# Patient Record
Sex: Male | Born: 1960 | Race: Black or African American | Hispanic: No | Marital: Married | Smoking: Former smoker
Health system: Southern US, Community
[De-identification: ages and names within clinical notes are randomized; demographics above are authoritative.]

## PROBLEM LIST (undated history)

## (undated) DIAGNOSIS — M5136 Other intervertebral disc degeneration, lumbar region: Secondary | ICD-10-CM

## (undated) DIAGNOSIS — M51369 Other intervertebral disc degeneration, lumbar region without mention of lumbar back pain or lower extremity pain: Secondary | ICD-10-CM

## (undated) DIAGNOSIS — M5430 Sciatica, unspecified side: Secondary | ICD-10-CM

## (undated) HISTORY — PX: SPINAL CORD STIMULATOR TRIAL: SHX5380

## (undated) HISTORY — PX: TOE SURGERY: SHX1073

## (undated) HISTORY — PX: APPENDECTOMY: SHX54

## (undated) HISTORY — PX: FRACTURE SURGERY: SHX138

---

## 2010-04-21 ENCOUNTER — Emergency Department: Payer: Self-pay | Admitting: Emergency Medicine

## 2013-12-03 ENCOUNTER — Emergency Department: Payer: Self-pay | Admitting: Emergency Medicine

## 2017-07-25 ENCOUNTER — Encounter: Payer: Self-pay | Admitting: Emergency Medicine

## 2017-07-25 ENCOUNTER — Emergency Department
Admission: EM | Admit: 2017-07-25 | Discharge: 2017-07-25 | Disposition: A | Discharge status: Home / Self Care | Payer: Commercial Managed Care - PPO | Attending: Emergency Medicine | Admitting: Emergency Medicine

## 2017-07-25 DIAGNOSIS — Y999 Unspecified external cause status: Secondary | ICD-10-CM | POA: Insufficient documentation

## 2017-07-25 DIAGNOSIS — S39012A Strain of muscle, fascia and tendon of lower back, initial encounter: Secondary | ICD-10-CM | POA: Insufficient documentation

## 2017-07-25 DIAGNOSIS — Y939 Activity, unspecified: Secondary | ICD-10-CM | POA: Insufficient documentation

## 2017-07-25 DIAGNOSIS — X58XXXA Exposure to other specified factors, initial encounter: Secondary | ICD-10-CM | POA: Insufficient documentation

## 2017-07-25 DIAGNOSIS — F17200 Nicotine dependence, unspecified, uncomplicated: Secondary | ICD-10-CM | POA: Diagnosis not present

## 2017-07-25 DIAGNOSIS — Y929 Unspecified place or not applicable: Secondary | ICD-10-CM | POA: Diagnosis not present

## 2017-07-25 DIAGNOSIS — S3992XA Unspecified injury of lower back, initial encounter: Secondary | ICD-10-CM | POA: Diagnosis present

## 2017-07-25 MED ORDER — NAPROXEN 500 MG PO TABS: 500.0000 mg | ORAL_TABLET | Freq: Two times a day (BID) | ORAL | 0 refills | Status: DC

## 2017-07-25 MED ORDER — KETOROLAC TROMETHAMINE 30 MG/ML IJ SOLN
30.0000 mg | Freq: Once | INTRAMUSCULAR | Status: AC
  Administered 2017-07-25: 30 mg via INTRAMUSCULAR
  Filled 2017-07-25: qty 1

## 2017-07-25 NOTE — ED Notes (Signed)
Pt discharged to home.  Discharge instructions reviewed.  Verbalized understanding.  No questions or concerns at this time.  Teach back verified.  Pt in NAD.  No items left in ED.   

## 2017-07-25 NOTE — ED Notes (Signed)
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  Pt states that he had a similar occurrence 2 months ago after painting shutters and that it went away.  Pt states this pain hasn't gone away yet.  Pt states it only hurts when he gets up and moves.  Pt states he has been taking excedrin OTC.

## 2017-07-25 NOTE — ED Triage Notes (Signed)
Lower back pain x 5 days, no injury recalled,

## 2017-07-25 NOTE — ED Provider Notes (Signed)
[facility] Emergency Department Provider Note   ____________________________________________   First MD Initiated Contact with Patient 07/25/17 1109     (approximate)  I have reviewed the triage vital signs and the nursing notes.   HISTORY  Chief Complaint Back Pain   HPI Anthony Guerrero is a 56 y.o. male is here complaining of low back pain for possibly 5 days. Patient states that he does a lot of lifting at work but denies any known injury. 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 He also lifted a grandchild yesterday with some pain. He has not taken any over-the-counter medication for his pain. He denies any paresthesias, saddle anesthesias, incontinence of bowel or bladder. He rates his pain as an 8 out of 10. Patient has continued to ambulate without assistance during this time.  History reviewed. No pertinent past medical history.  There are no active problems to display for this patient.   History reviewed. No pertinent surgical history.  Prior to Admission medications   Medication Sig Start Date End Date Taking? Authorizing Provider  naproxen (NAPROSYN) 500 MG tablet Take 1 tablet (500 mg total) by mouth 2 (two) times daily with a meal. 07/25/17   Johnn Hai, PA-C    Allergies Patient has no known allergies.  No family history on file.  Social History Social History  Substance Use Topics  . Smoking status: Current Every Day Smoker  . Smokeless tobacco: Never Used  . Alcohol use No    Review of Systems Constitutional: No fever/chills Cardiovascular: Denies chest pain. Respiratory: Denies shortness of breath. Gastrointestinal: No abdominal pain.  No nausea, no vomiting.  Genitourinary: Negative for dysuria. Musculoskeletal: Positive for low back pain. Skin: Negative for rash. Neurological: Negative for  focal weakness or numbness. ____________________________________________   PHYSICAL EXAM:  VITAL SIGNS: ED Triage  Vitals  Enc Vitals Group     BP 07/25/17 0955 131/75     Pulse Rate 07/25/17 0955 (!) 56     Resp 07/25/17 0955 18     Temp 07/25/17 0955 98.5 F (36.9 C)     Temp Source 07/25/17 0955 Oral     SpO2 07/25/17 0955 99 %     Weight 07/25/17 0949 160 lb (72.6 kg)     Height 07/25/17 0949 5\' 6"  (1.676 m)     Head Circumference --      Peak Flow --      Pain Score 07/25/17 0949 8     Pain Loc --      Pain Edu? --      Excl. in [place]? --    Constitutional: Alert and oriented. Well appearing and in no acute distress. Eyes: Conjunctivae are normal.  Head: Atraumatic. Neck: No stridor.  Nontender cervical spine to palpation posteriorly. Range of motion is without pain or restriction. Cardiovascular: Normal rate, regular rhythm. Grossly normal heart sounds.  Good peripheral circulation. Respiratory: Normal respiratory effort.  No retractions. Lungs CTAB. Gastrointestinal: Soft and nontender. No distention. No CVA tenderness. Musculoskeletal: On examination of the back there is no gross deformity. There is no tenderness on palpation of the thoracic or upper lumbar spine. There is some tenderness on palpation of the sacral area. Range of motion is slightly restricted secondary to discomfort. No active muscle spasms are seen. Patient is able to get up from sitting to standing without any assistance. Muscle strength is equal bilaterally. Straight leg raises are negative. Neurologic:  Normal speech and language. No gross focal  neurologic deficits are appreciated. No gait instability. Reflexes are 2+ bilaterally. Skin:  Skin is warm, dry and intact. No ecchymosis or abrasions are seen. Psychiatric: Mood and affect are normal. Speech and behavior are normal.  ____________________________________________   LABS (all labs ordered are listed, but only abnormal results are displayed)  Labs Reviewed - No data to display  PROCEDURES  Procedure(s) performed: None  Procedures  Critical Care performed:  No  ____________________________________________   INITIAL IMPRESSION / ASSESSMENT AND PLAN / ED COURSE Patient was given 30 mg IM in the department and was pain free prior to discharge. No imaging was done on today's visit. He was encouraged to follow up with his PCP at another clinic if any continued problems with his back.  ____________________________________________   FINAL CLINICAL IMPRESSION(S) / ED DIAGNOSES  Final diagnoses:  Strain of lumbar region, initial encounter      NEW MEDICATIONS STARTED DURING THIS VISIT:  New Prescriptions   NAPROXEN (NAPROSYN) 500 MG TABLET    Take 1 tablet (500 mg total) by mouth 2 (two) times daily with a meal.     Note:  This document was prepared using Dragon voice recognition software and may include unintentional dictation errors.    Johnn Hai, PA-C 07/25/17 1248    Earleen Newport, MD 07/25/17 [phone]

## 2017-07-25 NOTE — Discharge Instructions (Signed)
Follow-up with your primary care doctor or [facility] if any continued problems. Ice or heat to her back as needed for comfort. Begin taking naproxen 500 mg twice a day with food.

## 2017-10-09 ENCOUNTER — Other Ambulatory Visit: Payer: Self-pay | Admitting: Emergency Medicine

## 2018-04-24 DIAGNOSIS — H40023 Open angle with borderline findings, high risk, bilateral: Secondary | ICD-10-CM | POA: Diagnosis not present

## 2018-07-31 DIAGNOSIS — Z1211 Encounter for screening for malignant neoplasm of colon: Secondary | ICD-10-CM | POA: Diagnosis not present

## 2018-08-29 DIAGNOSIS — D128 Benign neoplasm of rectum: Secondary | ICD-10-CM | POA: Diagnosis not present

## 2018-08-29 DIAGNOSIS — K621 Rectal polyp: Secondary | ICD-10-CM | POA: Diagnosis not present

## 2018-08-29 DIAGNOSIS — Z1211 Encounter for screening for malignant neoplasm of colon: Secondary | ICD-10-CM | POA: Diagnosis not present

## 2018-08-29 DIAGNOSIS — K635 Polyp of colon: Secondary | ICD-10-CM | POA: Diagnosis not present

## 2018-08-29 DIAGNOSIS — K640 First degree hemorrhoids: Secondary | ICD-10-CM | POA: Diagnosis not present

## 2018-08-29 DIAGNOSIS — D127 Benign neoplasm of rectosigmoid junction: Secondary | ICD-10-CM | POA: Diagnosis not present

## 2018-08-30 DIAGNOSIS — Z0000 Encounter for general adult medical examination without abnormal findings: Secondary | ICD-10-CM | POA: Diagnosis not present

## 2018-08-30 DIAGNOSIS — R21 Rash and other nonspecific skin eruption: Secondary | ICD-10-CM | POA: Diagnosis not present

## 2018-08-30 DIAGNOSIS — M79675 Pain in left toe(s): Secondary | ICD-10-CM | POA: Diagnosis not present

## 2018-09-04 DIAGNOSIS — Z125 Encounter for screening for malignant neoplasm of prostate: Secondary | ICD-10-CM | POA: Diagnosis not present

## 2018-09-04 DIAGNOSIS — Z0000 Encounter for general adult medical examination without abnormal findings: Secondary | ICD-10-CM | POA: Diagnosis not present

## 2018-09-06 DIAGNOSIS — R7301 Impaired fasting glucose: Secondary | ICD-10-CM | POA: Diagnosis not present

## 2018-09-30 ENCOUNTER — Other Ambulatory Visit: Payer: Self-pay

## 2018-09-30 ENCOUNTER — Encounter: Payer: Self-pay | Admitting: Emergency Medicine

## 2018-09-30 ENCOUNTER — Emergency Department
Admission: EM | Admit: 2018-09-30 | Discharge: 2018-09-30 | Disposition: A | Discharge status: Home / Self Care | Payer: Commercial Managed Care - PPO | Attending: Emergency Medicine | Admitting: Emergency Medicine

## 2018-09-30 ENCOUNTER — Emergency Department: Payer: Commercial Managed Care - PPO

## 2018-09-30 DIAGNOSIS — Y998 Other external cause status: Secondary | ICD-10-CM | POA: Diagnosis not present

## 2018-09-30 DIAGNOSIS — W228XXA Striking against or struck by other objects, initial encounter: Secondary | ICD-10-CM | POA: Diagnosis not present

## 2018-09-30 DIAGNOSIS — Y9389 Activity, other specified: Secondary | ICD-10-CM | POA: Diagnosis not present

## 2018-09-30 DIAGNOSIS — S92514A Nondisplaced fracture of proximal phalanx of right lesser toe(s), initial encounter for closed fracture: Secondary | ICD-10-CM | POA: Diagnosis not present

## 2018-09-30 DIAGNOSIS — Y92019 Unspecified place in single-family (private) house as the place of occurrence of the external cause: Secondary | ICD-10-CM | POA: Diagnosis not present

## 2018-09-30 DIAGNOSIS — Z79899 Other long term (current) drug therapy: Secondary | ICD-10-CM | POA: Diagnosis not present

## 2018-09-30 DIAGNOSIS — S99921A Unspecified injury of right foot, initial encounter: Secondary | ICD-10-CM | POA: Diagnosis present

## 2018-09-30 DIAGNOSIS — F17200 Nicotine dependence, unspecified, uncomplicated: Secondary | ICD-10-CM | POA: Diagnosis not present

## 2018-09-30 MED ORDER — MELOXICAM 15 MG PO TABS: 15.0000 mg | ORAL_TABLET | Freq: Every day | ORAL | 0 refills | Status: DC

## 2018-09-30 NOTE — ED Triage Notes (Signed)
R pinky toe injury 4 days ago, still painful.

## 2018-09-30 NOTE — ED Provider Notes (Signed)
[facility] Emergency Department Provider Note  ____________________________________________  Time seen: Approximately 4:53 PM  I have reviewed the triage vital signs and the nursing notes.   HISTORY  Chief Complaint Toe Pain    HPI Anthony Guerrero is a 57 y.o. male who presents emergency department complaining of fifth digit right foot pain.  Patient reports that 4 days ago, he was walking through the house, accidentally struck his foot on a piece of furniture.  Patient reports that his toe went "sideways" and he felt a pop.  Patient reports that he has had ongoing pain, while this is improving, the pain is still constant.  Much worse with weightbearing or movement.  Patient was concerned that he may have broke his foot or his toe.  No medications for his complaint prior to arrival.  No other injury or complaint at this time.  Patient is ambulatory at this time with a slight limp.    History reviewed. No pertinent past medical history.  There are no active problems to display for this patient.   History reviewed. No pertinent surgical history.  Prior to Admission medications   Medication Sig Start Date End Date Taking? Authorizing Provider  meloxicam (MOBIC) 15 MG tablet Take 1 tablet (15 mg total) by mouth daily. 09/30/18   Babs Dabbs, Charline Bills, PA-C  naproxen (NAPROSYN) 500 MG tablet Take 1 tablet (500 mg total) by mouth 2 (two) times daily with a meal. 07/25/17   Johnn Hai, PA-C    Allergies Patient has no known allergies.  No family history on file.  Social History Social History   Tobacco Use  . Smoking status: Current Every Day Smoker  . Smokeless tobacco: Never Used  Substance Use Topics  . Alcohol use: No  . Drug use: No     Review of Systems  Constitutional: No fever/chills Eyes: No visual changes. No discharge ENT: No upper respiratory complaints. Cardiovascular: no chest pain. Respiratory: no cough. No  SOB. Gastrointestinal: No abdominal pain.  No nausea, no vomiting.   Musculoskeletal: Positive for fifth toe pain Skin: Negative for rash, abrasions, lacerations, ecchymosis. Neurological: Negative for headaches, focal weakness or numbness. 10-point ROS otherwise negative.  ____________________________________________   PHYSICAL EXAM:  VITAL SIGNS: ED Triage Vitals  Enc Vitals Group     BP 09/30/18 1501 (!) 112/48     Pulse Rate 09/30/18 1501 60     Resp 09/30/18 1501 20     Temp 09/30/18 1501 98.2 F (36.8 C)     Temp Source 09/30/18 1501 Oral     SpO2 09/30/18 1501 100 %     Weight 09/30/18 1502 167 lb (75.8 kg)     Height 09/30/18 1502 5\' 6"  (1.676 m)     Head Circumference --      Peak Flow --      Pain Score 09/30/18 1502 3     Pain Loc --      Pain Edu? --      Excl. in [place]? --      Constitutional: Alert and oriented. Well appearing and in no acute distress. Eyes: Conjunctivae are normal. PERRL. EOMI. Head: Atraumatic. Neck: No stridor.    Cardiovascular: Normal rate, regular rhythm. Normal S1 and S2.  Good peripheral circulation. Respiratory: Normal respiratory effort without tachypnea or retractions. Lungs CTAB. Good air entry to the bases with no decreased or absent breath sounds. Musculoskeletal: Full range of motion to all extremities. No gross deformities appreciated.  Visualization of  the right foot reveals no gross abnormality.  Patient does have minimal edema to the proximal phalanx of the fifth digit.  No ecchymosis, lacerations or abrasions.  Patient is very tender to palpation over the proximal phalanx of the fifth digit.  No tenderness to palpation of the osseous structures of the foot.  Sensation and capillary refill intact to the digit. Neurologic:  Normal speech and language. No gross focal neurologic deficits are appreciated.  Skin:  Skin is warm, dry and intact. No rash noted. Psychiatric: Mood and affect are normal. Speech and behavior are normal.  Patient exhibits appropriate insight and judgement.   ____________________________________________   LABS (all labs ordered are listed, but only abnormal results are displayed)  Labs Reviewed - No data to display ____________________________________________  EKG   ____________________________________________  RADIOLOGY I personally viewed and evaluated these images as part of my medical decision making, as well as reviewing the written report by the radiologist.  Dg Foot Complete Right  Result Date: 09/30/2018 CLINICAL DATA:  Fifth toe injury 4 days ago.  Pain. EXAM: RIGHT FOOT COMPLETE - 3+ VIEW COMPARISON:  None. FINDINGS: There is a fracture through the proximal fifth phalanx without significant displacement. IMPRESSION: There is a nondisplaced fracture through the proximal fifth phalanx. Electronically Signed   By: Dorise Bullion III M.D   On: 09/30/2018 16:34    ____________________________________________    PROCEDURES  Procedure(s) performed:    .Ortho Injury Treatment Date/Time: 09/30/2018 5:09 PM Performed by: Darletta Moll, PA-C Authorized by: Darletta Moll, PA-C   Consent:    Consent obtained:  Verbal   Consent given by:  Patient   Risks discussed:  FractureInjury location: toe Location details: right fifth toe Injury type: fracture Fracture type: proximal phalanx Pre-procedure neurovascular assessment: neurovascularly intact Pre-procedure distal perfusion: normal Pre-procedure neurological function: normal Pre-procedure range of motion: reduced Manipulation performed: no Immobilization: tape (Post-op shoe) Post-procedure neurovascular assessment: post-procedure neurovascularly intact Post-procedure distal perfusion: normal Post-procedure neurological function: normal Post-procedure range of motion: unchanged Patient tolerance: Patient tolerated the procedure well with no immediate complications       Medications - No data to  display   ____________________________________________   INITIAL IMPRESSION / ASSESSMENT AND PLAN / ED COURSE  Pertinent labs & imaging results that were available during my care of the patient were reviewed by me and considered in my medical decision making (see chart for details).  Review of the [place] CSRS was performed in accordance of the [place] prior to dispensing any controlled drugs.      Patient's diagnosis is consistent with toe fracture.  Patient presents emergency department with ongoing pain to the fifth digit of the right foot.  On exam, minimal edema over the proximal phalanx.  Pain is localized to this area.  Imaging reveals nondisplaced fracture of the proximal phalanx of the fifth digit.  Patient's toe was buddy taped, postop shoe is given.  Patient to be prescribed meloxicam for additional symptom relief.  Follow-up with primary care or podiatry as needed. Patient is given ED precautions to return to the ED for any worsening or new symptoms.     ____________________________________________  FINAL CLINICAL IMPRESSION(S) / ED DIAGNOSES  Final diagnoses:  Closed nondisplaced fracture of proximal phalanx of lesser toe of right foot, initial encounter      NEW MEDICATIONS STARTED DURING THIS VISIT:  ED Discharge Orders         Ordered    meloxicam (MOBIC) 15 MG tablet  Daily  09/30/18 1711              This chart was dictated using voice recognition software/Dragon. Despite best efforts to proofread, errors can occur which can change the meaning. Any change was purely unintentional.    Darletta Moll, PA-C 09/30/18 1711    Lavonia Drafts, MD 09/30/18 1818

## 2018-12-23 ENCOUNTER — Emergency Department
Admission: EM | Admit: 2018-12-23 | Discharge: 2018-12-23 | Disposition: A | Discharge status: Home / Self Care | Payer: Commercial Managed Care - PPO | Attending: Emergency Medicine | Admitting: Emergency Medicine

## 2018-12-23 ENCOUNTER — Other Ambulatory Visit: Payer: Self-pay

## 2018-12-23 ENCOUNTER — Encounter: Payer: Self-pay | Admitting: Emergency Medicine

## 2018-12-23 DIAGNOSIS — R509 Fever, unspecified: Secondary | ICD-10-CM | POA: Diagnosis present

## 2018-12-23 DIAGNOSIS — Z87891 Personal history of nicotine dependence: Secondary | ICD-10-CM | POA: Insufficient documentation

## 2018-12-23 DIAGNOSIS — J101 Influenza due to other identified influenza virus with other respiratory manifestations: Secondary | ICD-10-CM

## 2018-12-23 DIAGNOSIS — Z79899 Other long term (current) drug therapy: Secondary | ICD-10-CM | POA: Diagnosis not present

## 2018-12-23 DIAGNOSIS — R05 Cough: Secondary | ICD-10-CM | POA: Diagnosis not present

## 2018-12-23 LAB — INFLUENZA PANEL BY PCR (TYPE A & B)
Influenza A By PCR: POSITIVE — AB
Influenza B By PCR: NEGATIVE

## 2018-12-23 MED ORDER — IBUPROFEN 800 MG PO TABS
800.0000 mg | ORAL_TABLET | Freq: Once | ORAL | Status: AC
  Administered 2018-12-23: 800 mg via ORAL
  Filled 2018-12-23: qty 1

## 2018-12-23 MED ORDER — FLUTICASONE PROPIONATE 50 MCG/ACT NA SUSP: 1.0000 | Freq: Two times a day (BID) | NASAL | 0 refills | Status: DC

## 2018-12-23 MED ORDER — BENZONATATE 100 MG PO CAPS: 100.0000 mg | ORAL_CAPSULE | Freq: Four times a day (QID) | ORAL | 0 refills | Status: AC | PRN

## 2018-12-23 NOTE — ED Provider Notes (Signed)
[facility] Emergency Department Provider Note  ____________________________________________  Time seen: Approximately 9:20 PM  I have reviewed the triage vital signs and the nursing notes.   HISTORY  Chief Complaint Sore Throat; Fever; Cough; and Generalized Body Aches    HPI Anthony Guerrero is a 58 y.o. male who presents the emergency department with 2-day history of fever, cough, body aches.  Patient reports that symptoms hit rather suddenly.  Patient has been using DayQuil and NyQuil without significant relief.  Patient denies any visual changes, neck pain or stiffness, chest pain, shortness of breath, abdominal pain, nausea or vomiting.         History reviewed. No pertinent past medical history.  There are no active problems to display for this patient.   History reviewed. No pertinent surgical history.  Prior to Admission medications   Medication Sig Start Date End Date Taking? Authorizing Provider  augmented betamethasone dipropionate (DIPROLENE-AF) 0.05 % cream Apply topically 2 (two) times daily.   Yes [provider]  latanoprost (XALATAN) 0.005 % ophthalmic solution Place 1 drop into both eyes at bedtime.   Yes [provider]  benzonatate (TESSALON PERLES) 100 MG capsule Take 1 capsule (100 mg total) by mouth every 6 (six) hours as needed for cough. 12/23/18 12/23/19  Tayanna Talford, Charline Bills, PA-C  fluticasone (FLONASE) 50 MCG/ACT nasal spray Place 1 spray into both nostrils 2 (two) times daily. 12/23/18   Deundra Furber, Charline Bills, PA-C    Allergies Patient has no known allergies.  History reviewed. No pertinent family history.  Social History Social History   Tobacco Use  . Smoking status: Former Research scientist (life sciences)  . Smokeless tobacco: Never Used  . Tobacco comment: quit 26 years ago  Substance Use Topics  . Alcohol use: No  . Drug use: No     Review of Systems  Constitutional: Positive fever/chills Eyes: No visual changes.  No discharge ENT: Positive for nasal congestion Cardiovascular: no chest pain. Respiratory: Positive cough. No SOB. Gastrointestinal: No abdominal pain.  No nausea, no vomiting.  No diarrhea.  No constipation. Musculoskeletal: Negative for musculoskeletal pain. Skin: Negative for rash, abrasions, lacerations, ecchymosis. Neurological: Negative for headaches, focal weakness or numbness. 10-point ROS otherwise negative.  ____________________________________________   PHYSICAL EXAM:  VITAL SIGNS: ED Triage Vitals [12/23/18 1929]  Enc Vitals Group     BP 133/79     Pulse Rate 86     Resp 18     Temp (!) 100.6 F (38.1 C)     Temp Source Oral     SpO2 98 %     Weight 168 lb (76.2 kg)     Height 5\' 6"  (1.676 m)     Head Circumference      Peak Flow      Pain Score 3     Pain Loc      Pain Edu?      Excl. in [place]?      Constitutional: Alert and oriented. Well appearing and in no acute distress. Eyes: Conjunctivae are normal. PERRL. EOMI. Head: Atraumatic. ENT:      Ears: EACs and TMs unremarkable bilaterally.      Nose: No congestion/rhinnorhea.      Mouth/Throat: Mucous membranes are moist.  Neck: No stridor.  Neck is supple full range of motion Hematological/Lymphatic/Immunilogical: Scattered, mobile, nontender anterior cervical lymphadenopathy. Cardiovascular: Normal rate, regular rhythm. Normal S1 and S2.  Good peripheral circulation. Respiratory: Normal respiratory effort without tachypnea or retractions. Lungs CTAB. Good air  entry to the bases with no decreased or absent breath sounds. Musculoskeletal: Full range of motion to all extremities. No gross deformities appreciated. Neurologic:  Normal speech and language. No gross focal neurologic deficits are appreciated.  Skin:  Skin is warm, dry and intact. No rash noted. Psychiatric: Mood and affect are normal. Speech and behavior are normal. Patient exhibits appropriate insight and  judgement.   ____________________________________________   LABS (all labs ordered are listed, but only abnormal results are displayed)  Labs Reviewed  INFLUENZA PANEL BY PCR (TYPE A & B) - Abnormal; Notable for the following components:      Result Value   Influenza A By PCR POSITIVE (*)    All other components within normal limits   ____________________________________________  EKG   ____________________________________________  RADIOLOGY   No results found.  ____________________________________________    PROCEDURES  Procedure(s) performed:    Procedures    Medications  ibuprofen (ADVIL,MOTRIN) tablet 800 mg (800 mg Oral Given 12/23/18 1939)     ____________________________________________   INITIAL IMPRESSION / ASSESSMENT AND PLAN / ED COURSE  Pertinent labs & imaging results that were available during my care of the patient were reviewed by me and considered in my medical decision making (see chart for details).  Review of the [place] CSRS was performed in accordance of the [place] prior to dispensing any controlled drugs.           Patient's diagnosis is consistent with influenza A.  Patient presents the emergency department with flulike symptoms.  Patient is positive for influenza A on testing.  After discussion about Tamiflu, patient declines Tamiflu.  Tylenol Motrin at home.  Patient will have Flonase, Tessalon Perles for additional symptom control.  Follow-up with primary care as needed..Patient is given ED precautions to return to the ED for any worsening or new symptoms.     ____________________________________________  FINAL CLINICAL IMPRESSION(S) / ED DIAGNOSES  Final diagnoses:  Influenza A      NEW MEDICATIONS STARTED DURING THIS VISIT:  ED Discharge Orders         Ordered    fluticasone (FLONASE) 50 MCG/ACT nasal spray  2 times daily     12/23/18 2122    benzonatate (TESSALON PERLES) 100 MG capsule  Every 6 hours PRN     12/23/18  2122              This chart was dictated using voice recognition software/Dragon. Despite best efforts to proofread, errors can occur which can change the meaning. Any change was purely unintentional.    Darletta Moll, PA-C 12/23/18 2123    Nance Pear, MD 12/23/18 [phone]

## 2018-12-23 NOTE — ED Triage Notes (Signed)
Pt says yesterday he started with a scratchy throat; now with headache, body aches, non productive cough and fever noted today in triage; pt says taking daytime/night time nyquil with no relief

## 2018-12-23 NOTE — ED Notes (Signed)
See triage note. Pt with fever, cough, generalized body aches and malaise x2 days. Pt self medicating with OTC nyquil/dayquil. Last dose at 1400 today. Pt [id number].

## 2018-12-23 NOTE — ED Triage Notes (Signed)
FIRST NURSE NOTE-flu like symptoms. NAD. Mask applied.

## 2018-12-25 DIAGNOSIS — J101 Influenza due to other identified influenza virus with other respiratory manifestations: Secondary | ICD-10-CM | POA: Diagnosis not present

## 2018-12-25 DIAGNOSIS — R52 Pain, unspecified: Secondary | ICD-10-CM | POA: Diagnosis not present

## 2020-01-14 ENCOUNTER — Ambulatory Visit: Payer: Commercial Managed Care - PPO | Attending: Internal Medicine

## 2020-01-14 DIAGNOSIS — Z23 Encounter for immunization: Secondary | ICD-10-CM

## 2020-01-14 NOTE — Progress Notes (Signed)
   Covid-19 Vaccination Clinic  Name:  Anthony Guerrero    MRN: [id number] DOB: 1961/03/01  01/14/2020  Mr. Purk was observed post Covid-19 immunization for 15 minutes without incident. He was provided with Vaccine Information Sheet and instruction to access the V-Safe system.   Mr. Brisky was instructed to call 911 with any severe reactions post vaccine: Marland Kitchen Difficulty breathing  . Swelling of face and throat  . A fast heartbeat  . A bad rash all over body  . Dizziness and weakness   Immunizations Administered    Name Date Dose VIS Date Route   Pfizer COVID-19 Vaccine 01/14/2020 10:01 AM 0.3 mL 09/28/2019 Intramuscular   Manufacturer: [employer]   Lot: [id number]   [place]: [id number]

## 2020-02-04 ENCOUNTER — Ambulatory Visit: Payer: Commercial Managed Care - PPO | Attending: Internal Medicine

## 2020-02-04 DIAGNOSIS — Z23 Encounter for immunization: Secondary | ICD-10-CM

## 2020-02-04 NOTE — Progress Notes (Signed)
   Covid-19 Vaccination Clinic  Name:  LERAY GRUMBINE    MRN: [id number] DOB: [birth year]  02/04/2020  Mr. Manspeaker was observed post Covid-19 immunization for 15 minutes without incident. He was provided with Vaccine Information Sheet and instruction to access the V-Safe system.   Mr. Messenger was instructed to call 911 with any severe reactions post vaccine: Marland Kitchen Difficulty breathing  . Swelling of face and throat  . A fast heartbeat  . A bad rash all over body  . Dizziness and weakness   Immunizations Administered    Name Date Dose VIS Date Route   Pfizer COVID-19 Vaccine 02/04/2020 10:11 AM 0.3 mL 12/12/2018 Intramuscular   Manufacturer: [employer]   Lot: [id number]   [place]: [id number]

## 2020-04-28 ENCOUNTER — Other Ambulatory Visit: Payer: Self-pay

## 2020-04-28 ENCOUNTER — Emergency Department
Admission: EM | Admit: 2020-04-28 | Discharge: 2020-04-28 | Disposition: A | Discharge status: Home / Self Care | Payer: Worker's Compensation | Attending: Emergency Medicine | Admitting: Emergency Medicine

## 2020-04-28 ENCOUNTER — Encounter: Payer: Self-pay | Admitting: Emergency Medicine

## 2020-04-28 ENCOUNTER — Emergency Department: Payer: Worker's Compensation

## 2020-04-28 DIAGNOSIS — S9031XA Contusion of right foot, initial encounter: Secondary | ICD-10-CM | POA: Insufficient documentation

## 2020-04-28 DIAGNOSIS — Y939 Activity, unspecified: Secondary | ICD-10-CM | POA: Insufficient documentation

## 2020-04-28 DIAGNOSIS — Y9289 Other specified places as the place of occurrence of the external cause: Secondary | ICD-10-CM | POA: Insufficient documentation

## 2020-04-28 DIAGNOSIS — W19XXXA Unspecified fall, initial encounter: Secondary | ICD-10-CM | POA: Diagnosis not present

## 2020-04-28 DIAGNOSIS — Z87891 Personal history of nicotine dependence: Secondary | ICD-10-CM | POA: Diagnosis not present

## 2020-04-28 DIAGNOSIS — S99921A Unspecified injury of right foot, initial encounter: Secondary | ICD-10-CM | POA: Diagnosis present

## 2020-04-28 DIAGNOSIS — Y999 Unspecified external cause status: Secondary | ICD-10-CM | POA: Insufficient documentation

## 2020-04-28 DIAGNOSIS — T1490XA Injury, unspecified, initial encounter: Secondary | ICD-10-CM

## 2020-04-28 MED ORDER — TRAMADOL HCL 50 MG PO TABS: 50.0000 mg | ORAL_TABLET | Freq: Four times a day (QID) | ORAL | 0 refills | Status: DC | PRN

## 2020-04-28 MED ORDER — LIDOCAINE 5 % EX PTCH
1.0000 | MEDICATED_PATCH | CUTANEOUS | Status: DC
  Administered 2020-04-28: 14:00:00 1 via TRANSDERMAL
  Filled 2020-04-28: qty 1

## 2020-04-28 MED ORDER — IBUPROFEN 600 MG PO TABS: 600.0000 mg | ORAL_TABLET | Freq: Three times a day (TID) | ORAL | 0 refills | Status: DC | PRN

## 2020-04-28 NOTE — ED Triage Notes (Signed)
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

## 2020-04-28 NOTE — ED Notes (Addendum)
Pt states pain to right foot after incident at work. 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

## 2020-04-28 NOTE — ED Provider Notes (Signed)
[facility] Emergency Department Provider Note   ____________________________________________   First MD Initiated Contact with Patient 04/28/20 1310     (approximate)  I have reviewed the triage vital signs and the nursing notes.   HISTORY  Chief Complaint Foot Injury    HPI Anthony Guerrero is a 59 y.o. male patient presents with right foot pain secondary to contusion. Patient state he was moving a table at work when he fell on his right foot injuring the first and second toe. Patient does not want to follow-up Worker's Compensation. Rates pain as 8/10. Describes pain as "achy". No palliative measures prior to arrival.         History reviewed. No pertinent past medical history.  There are no problems to display for this patient.   History reviewed. No pertinent surgical history.  Prior to Admission medications   Medication Sig Start Date End Date Taking? Authorizing Provider  augmented betamethasone dipropionate (DIPROLENE-AF) 0.05 % cream Apply topically 2 (two) times daily.    [provider]  fluticasone (FLONASE) 50 MCG/ACT nasal spray Place 1 spray into both nostrils 2 (two) times daily. 12/23/18   Cuthriell, Charline Bills, PA-C  ibuprofen (ADVIL) 600 MG tablet Take 1 tablet (600 mg total) by mouth every 8 (eight) hours as needed. 04/28/20   Sable Feil, PA-C  latanoprost (XALATAN) 0.005 % ophthalmic solution Place 1 drop into both eyes at bedtime.    [provider]  traMADol (ULTRAM) 50 MG tablet Take 1 tablet (50 mg total) by mouth every 6 (six) hours as needed for moderate pain. 04/28/20   Sable Feil, PA-C    Allergies Patient has no known allergies.  No family history on file.  Social History Social History   Tobacco Use  . Smoking status: Former Research scientist (life sciences)  . Smokeless tobacco: Never Used  . Tobacco comment: quit 26 years ago  Substance Use Topics  . Alcohol use: No  . Drug use: No    Review of  Systems Constitutional: No fever/chills Eyes: No visual changes. ENT: No sore throat. Cardiovascular: Denies chest pain. Respiratory: Denies shortness of breath. Gastrointestinal: No abdominal pain.  No nausea, no vomiting.  No diarrhea.  No constipation. Genitourinary: Negative for dysuria. Musculoskeletal: Right foot pain involving the first and second digit.  Skin: Negative for rash. Neurological: Negative for headaches, focal weakness or numbness.   ____________________________________________   PHYSICAL EXAM:  VITAL SIGNS: ED Triage Vitals  Enc Vitals Group     BP 04/28/20 1308 113/89     Pulse Rate 04/28/20 1308 62     Resp 04/28/20 1308 16     Temp 04/28/20 1308 98.3 F (36.8 C)     Temp Source 04/28/20 1308 Oral     SpO2 04/28/20 1308 98 %     Weight 04/28/20 1305 167 lb 15.9 oz (76.2 kg)     Height 04/28/20 1305 5\' 6"  (1.676 m)     Head Circumference --      Peak Flow --      Pain Score 04/28/20 1304 8     Pain Loc --      Pain Edu? --      Excl. in [place]? --    Constitutional: Alert and oriented. Well appearing and in no acute distress. Cardiovascular: Normal rate, regular rhythm. Grossly normal heart sounds.  Good peripheral circulation. Respiratory: Normal respiratory effort.  No retractions. Lungs CTAB. Musculoskeletal: Edema first and second digit right foot. Neurologic:  Normal speech and language. No gross focal neurologic deficits are appreciated. No gait instability. Skin:  Skin is warm, dry and intact. No rash noted. Ecchymosis first digit right foot.   ____________________________________________   LABS (all labs ordered are listed, but only abnormal results are displayed)  Labs Reviewed - No data to display ____________________________________________  EKG   ____________________________________________  RADIOLOGY  ED MD interpretation:    Official radiology report(s): DG Foot Complete Right  Result Date: 04/28/2020 CLINICAL DATA:   Heavy object fell on foot EXAM: RIGHT FOOT COMPLETE - 3+ VIEW COMPARISON:  None. FINDINGS: Frontal, oblique, and lateral views were obtained. There is no fracture or dislocation. The joint spaces appear normal. There is a small inferior calcaneal spur. IMPRESSION: No fracture or dislocation. No appreciable arthropathy. Small inferior calcaneal spur present. Electronically Signed   By: Lowella Grip III M.D.   On: 04/28/2020 13:32    ____________________________________________   PROCEDURES  Procedure(s) performed (including Critical Care):  Procedures   ____________________________________________   INITIAL IMPRESSION / ASSESSMENT AND PLAN / ED COURSE  As part of my medical decision making, I reviewed the following data within the [place]     Patient presents with right foot pain secondary to contusion by a table that fell during a lifting incident.  Discussed no acute findings on x-ray of the right foot.  Patient complaint physical exam consistent with right foot contusion.  Patient given discharge care instructions advised take medication as needed.  Patient advised to follow-up with PCP.  Patient did not want to report this is a work-related injury.    Anthony Guerrero was evaluated in Emergency Department on 04/28/2020 for the symptoms described in the history of present illness. He was evaluated in the context of the global COVID-19 pandemic, which necessitated consideration that the patient might be at risk for infection with the SARS-CoV-2 virus that causes COVID-19. Institutional protocols and algorithms that pertain to the evaluation of patients at risk for COVID-19 are in a state of rapid change based on information released by regulatory bodies including the CDC and federal and state organizations. These policies and algorithms were followed during the patient's care in the ED.       ____________________________________________   FINAL CLINICAL  IMPRESSION(S) / ED DIAGNOSES  Final diagnoses:  Trauma  Contusion of right foot, initial encounter     ED Discharge Orders         Ordered    traMADol (ULTRAM) 50 MG tablet  Every 6 hours PRN     Discontinue  Reprint     04/28/20 1402    ibuprofen (ADVIL) 600 MG tablet  Every 8 hours PRN     Discontinue  Reprint     04/28/20 1402           Note:  This document was prepared using Dragon voice recognition software and may include unintentional dictation errors.    Sable Feil, PA-C 04/28/20 1416    Duffy Bruce, MD 04/29/20 [phone]

## 2020-04-29 ENCOUNTER — Other Ambulatory Visit: Payer: Self-pay

## 2020-04-29 ENCOUNTER — Encounter: Payer: Self-pay | Admitting: Emergency Medicine

## 2020-04-29 DIAGNOSIS — Y929 Unspecified place or not applicable: Secondary | ICD-10-CM | POA: Insufficient documentation

## 2020-04-29 DIAGNOSIS — S90111A Contusion of right great toe without damage to nail, initial encounter: Secondary | ICD-10-CM | POA: Diagnosis not present

## 2020-04-29 DIAGNOSIS — S90931A Unspecified superficial injury of right great toe, initial encounter: Secondary | ICD-10-CM | POA: Diagnosis present

## 2020-04-29 DIAGNOSIS — Y999 Unspecified external cause status: Secondary | ICD-10-CM | POA: Insufficient documentation

## 2020-04-29 DIAGNOSIS — W208XXA Other cause of strike by thrown, projected or falling object, initial encounter: Secondary | ICD-10-CM | POA: Insufficient documentation

## 2020-04-29 DIAGNOSIS — Z87891 Personal history of nicotine dependence: Secondary | ICD-10-CM | POA: Diagnosis not present

## 2020-04-29 DIAGNOSIS — Y939 Activity, unspecified: Secondary | ICD-10-CM | POA: Diagnosis not present

## 2020-04-29 NOTE — ED Triage Notes (Signed)
Patient ambulatory to triage with steady gait, without difficulty or distress noted; pt seen yesterday for rt foot injury; returns today for concern over large blister to great toe

## 2020-04-30 ENCOUNTER — Emergency Department
Admission: EM | Admit: 2020-04-30 | Discharge: 2020-04-30 | Disposition: A | Discharge status: Home / Self Care | Payer: Worker's Compensation | Attending: Emergency Medicine | Admitting: Emergency Medicine

## 2020-04-30 DIAGNOSIS — S90121A Contusion of right lesser toe(s) without damage to nail, initial encounter: Secondary | ICD-10-CM

## 2020-04-30 NOTE — ED Notes (Signed)
Boot applied with abd pad placed over foot for added protection. Crutches provided and explained by this RN. Pt denies further questions at this time and was able to ambulate to care after RN wheeled him to parking lot in wheelchair.

## 2020-04-30 NOTE — ED Provider Notes (Signed)
[facility] Emergency Department Provider Note   ____________________________________________   First MD Initiated Contact with Patient 04/30/20 [phone]     (approximate)  I have reviewed the triage vital signs and the nursing notes.   HISTORY  Chief Complaint Foot Injury    HPI Anthony Guerrero is a 59 y.o. male who returns to the ED for evaluation of right great toe.  Patient was seen in the ED yesterday for right foot contusion after a table he was moving fell onto his right foot.  Tonight patient noted a blister on the top of his toe.  States he worked all day today and notes swelling to the entire foot.  Denies fever, chest pain, shortness of breath, abdominal pain, nausea or vomiting.  Denies anticoagulant use.  Denies history of diabetes.       Past medical history None  There are no problems to display for this patient.   History reviewed. No pertinent surgical history.  Prior to Admission medications   Medication Sig Start Date End Date Taking? Authorizing Provider  augmented betamethasone dipropionate (DIPROLENE-AF) 0.05 % cream Apply topically 2 (two) times daily.    [provider]  fluticasone (FLONASE) 50 MCG/ACT nasal spray Place 1 spray into both nostrils 2 (two) times daily. 12/23/18   Cuthriell, Charline Bills, PA-C  ibuprofen (ADVIL) 600 MG tablet Take 1 tablet (600 mg total) by mouth every 8 (eight) hours as needed. 04/28/20   Sable Feil, PA-C  latanoprost (XALATAN) 0.005 % ophthalmic solution Place 1 drop into both eyes at bedtime.    [provider]  traMADol (ULTRAM) 50 MG tablet Take 1 tablet (50 mg total) by mouth every 6 (six) hours as needed for moderate pain. 04/28/20   Sable Feil, PA-C    Allergies Patient has no known allergies.  No family history on file.  Social History Social History   Tobacco Use  . Smoking status: Former Research scientist (life sciences)  . Smokeless tobacco: Never Used  . Tobacco comment: quit 26  years ago  Substance Use Topics  . Alcohol use: No  . Drug use: No    Review of Systems  Constitutional: No fever/chills Eyes: No visual changes. ENT: No sore throat. Cardiovascular: Denies chest pain. Respiratory: Denies shortness of breath. Gastrointestinal: No abdominal pain.  No nausea, no vomiting.  No diarrhea.  No constipation. Genitourinary: Negative for dysuria. Musculoskeletal: Positive for blister to right toe.  Negative for back pain. Skin: Negative for rash. Neurological: Negative for headaches, focal weakness or numbness.   ____________________________________________   PHYSICAL EXAM:  VITAL SIGNS: ED Triage Vitals  Enc Vitals Group     BP 04/30/20 0009 128/75     Pulse Rate 04/30/20 0009 62     Resp 04/30/20 0009 16     Temp 04/30/20 0009 98.2 F (36.8 C)     Temp Source 04/30/20 0009 Oral     SpO2 04/30/20 0009 99 %     Weight 04/29/20 2310 167 lb 15.9 oz (76.2 kg)     Height 04/29/20 2310 5\' 6"  (1.676 m)     Head Circumference --      Peak Flow --      Pain Score 04/29/20 2310 0     Pain Loc --      Pain Edu? --      Excl. in [place]? --     Constitutional: Alert and oriented. Well appearing and in no acute distress. Eyes: Conjunctivae are normal. PERRL.  EOMI. Head: Atraumatic. Nose: No congestion/rhinnorhea. Mouth/Throat: Mucous membranes are moist.  Oropharynx non-erythematous. Neck: No stridor.   Cardiovascular: Normal rate, regular rhythm. Grossly normal heart sounds.  Good peripheral circulation. Respiratory: Normal respiratory effort.  No retractions. Lungs CTAB. Gastrointestinal: Soft and nontender. No distention. No abdominal bruits. No CVA tenderness. Musculoskeletal:  Right foot: Generalized mild swelling.  Right great toe with hemorrhagic blister dorsally.  Pad of great toe and sole of foot with bruising.  No subungual hematoma.  2+ distal pulses.  Brisk, less than 5-second capillary refill. Neurologic:  Normal speech and language. No  gross focal neurologic deficits are appreciated.  Skin:  Skin is warm, dry and intact. No rash noted. Psychiatric: Mood and affect are normal. Speech and behavior are normal.  ____________________________________________   LABS (all labs ordered are listed, but only abnormal results are displayed)  Labs Reviewed - No data to display ____________________________________________  EKG  None ____________________________________________  RADIOLOGY  ED MD interpretation: None  Official radiology report(s): No results found.  ____________________________________________   PROCEDURES  Procedure(s) performed (including Critical Care):  Procedures   ____________________________________________   INITIAL IMPRESSION / ASSESSMENT AND PLAN / ED COURSE  As part of my medical decision making, I reviewed the following data within the [place] notes reviewed and incorporated, Old chart reviewed and Notes from prior ED visits     Anthony Guerrero was evaluated in Emergency Department on 04/30/2020 for the symptoms described in the history of present illness. He was evaluated in the context of the global COVID-19 pandemic, which necessitated consideration that the patient might be at risk for infection with the SARS-CoV-2 virus that causes COVID-19. Institutional protocols and algorithms that pertain to the evaluation of patients at risk for COVID-19 are in a state of rapid change based on information released by regulatory bodies including the CDC and federal and state organizations. These policies and algorithms were followed during the patient's care in the ED.    59 year old male who returns to the ED for evaluation of hemorrhagic blister on the dorsum of his right great toe.  Advised patient to not pop it and to keep the skin intact as long as possible to prevent infection.  Will place in podiatric shoe, provide crutches and patient will follow up with  podiatry as needed.  Strict return precautions given.  Patient verbalizes understanding and agrees with plan of care.      ____________________________________________   FINAL CLINICAL IMPRESSION(S) / ED DIAGNOSES  Final diagnoses:  Toe hematoma, right, initial encounter     ED Discharge Orders    None       Note:  This document was prepared using Dragon voice recognition software and may include unintentional dictation errors.   Paulette Blanch, MD 04/30/20 [phone]

## 2020-04-30 NOTE — ED Notes (Signed)
MD at bedside. 

## 2020-04-30 NOTE — Discharge Instructions (Signed)
Wear podiatric shoe and use crutches as needed.  Elevate affected area as much as possible.  Do not pop the blister; keep it intact as long as possible.  Return to the ER for worsening symptoms, redness/swelling, purulent discharge or other concerns.

## 2020-05-05 ENCOUNTER — Other Ambulatory Visit: Payer: Self-pay | Admitting: Physician Assistant

## 2021-01-21 ENCOUNTER — Emergency Department
Admission: EM | Admit: 2021-01-21 | Discharge: 2021-01-21 | Disposition: A | Discharge status: Home / Self Care | Payer: Commercial Managed Care - PPO | Attending: Emergency Medicine | Admitting: Emergency Medicine

## 2021-01-21 ENCOUNTER — Other Ambulatory Visit: Payer: Self-pay

## 2021-01-21 DIAGNOSIS — Z87891 Personal history of nicotine dependence: Secondary | ICD-10-CM | POA: Insufficient documentation

## 2021-01-21 DIAGNOSIS — J111 Influenza due to unidentified influenza virus with other respiratory manifestations: Secondary | ICD-10-CM | POA: Diagnosis not present

## 2021-01-21 DIAGNOSIS — Z20822 Contact with and (suspected) exposure to covid-19: Secondary | ICD-10-CM | POA: Insufficient documentation

## 2021-01-21 DIAGNOSIS — R0981 Nasal congestion: Secondary | ICD-10-CM | POA: Diagnosis present

## 2021-01-21 LAB — RESP PANEL BY RT-PCR (FLU A&B, COVID) ARPGX2
Influenza A by PCR: NEGATIVE
Influenza B by PCR: NEGATIVE
SARS Coronavirus 2 by RT PCR: NEGATIVE

## 2021-01-21 MED ORDER — OSELTAMIVIR PHOSPHATE 75 MG PO CAPS: 75.0000 mg | ORAL_CAPSULE | Freq: Two times a day (BID) | ORAL | 0 refills | Status: DC

## 2021-01-21 NOTE — ED Triage Notes (Signed)
Pt comes with c/o flu like symptoms for couple of days. Pt states body aches, fever and chills.

## 2021-01-21 NOTE — ED Notes (Signed)
Discharge instructions reviewed with pt , pt calm , collective denies pain or sob

## 2021-01-21 NOTE — ED Provider Notes (Signed)
[facility] Emergency Department Provider Note  ____________________________________________   Event Date/Time   First MD Initiated Contact with Patient 01/21/21 1434     (approximate)  I have reviewed the triage vital signs and the nursing notes.   HISTORY  Chief Complaint flu symptoms    HPI Anthony Guerrero is a 60 y.o. male presents to the emergency department with URI symptoms for 2 days.   Is complaining of cough, congestion, fever, chills, denies chest pain, denies shortness of breath denies close contact with Covid19+ patient, patient is vaccinated and had his booster.  Patient states family members are positive for influenza.   History reviewed. No pertinent past medical history.  There are no problems to display for this patient.   History reviewed. No pertinent surgical history.  Prior to Admission medications   Medication Sig Start Date End Date Taking? Authorizing Provider  oseltamivir (TAMIFLU) 75 MG capsule Take 1 capsule (75 mg total) by mouth 2 (two) times daily. 01/21/21  Yes Jasim Harari, Linden Dolin, PA-C  augmented betamethasone dipropionate (DIPROLENE-AF) 0.05 % cream Apply topically 2 (two) times daily.    [provider]  fluticasone (FLONASE) 50 MCG/ACT nasal spray Place 1 spray into both nostrils 2 (two) times daily. 12/23/18   Cuthriell, Charline Bills, PA-C  ibuprofen (ADVIL) 600 MG tablet Take 1 tablet (600 mg total) by mouth every 8 (eight) hours as needed. 04/28/20   Sable Feil, PA-C  latanoprost (XALATAN) 0.005 % ophthalmic solution Place 1 drop into both eyes at bedtime.    [provider]  traMADol (ULTRAM) 50 MG tablet Take 1 tablet (50 mg total) by mouth every 6 (six) hours as needed for moderate pain. 04/28/20   Sable Feil, PA-C    Allergies Patient has no known allergies.  No family history on file.  Social History Social History   Tobacco Use  . Smoking status: Former Research scientist (life sciences)  . Smokeless  tobacco: Never Used  . Tobacco comment: quit 26 years ago  Substance Use Topics  . Alcohol use: No  . Drug use: No    Review of Systems  Constitutional: Positive fever/chills Eyes: No visual changes. ENT: Denies sore throat. Respiratory: Positive cough Cardiovascular: Denies chest pain Gastrointestinal: Denies abdominal pain Genitourinary: Negative for dysuria. Musculoskeletal: Negative for back pain. Skin: Negative for rash. Neurological: Denies neurological changes    ____________________________________________   PHYSICAL EXAM:  VITAL SIGNS: ED Triage Vitals  Enc Vitals Group     BP 01/21/21 1438 114/72     Pulse Rate 01/21/21 1438 71     Resp 01/21/21 1438 19     Temp 01/21/21 1438 98.3 F (36.8 C)     Temp Source 01/21/21 1438 Oral     SpO2 01/21/21 1438 97 %     Weight 01/21/21 1438 187 lb (84.8 kg)     Height 01/21/21 1438 5\' 6"  (1.676 m)     Head Circumference --      Peak Flow --      Pain Score 01/21/21 1431 6     Pain Loc --      Pain Edu? --      Excl. in [place]? --     Constitutional: Alert and oriented. Well appearing and in no acute distress. Eyes: Conjunctivae are normal.  Head: Atraumatic. Nose: No congestion/rhinnorhea. Mouth/Throat: Mucous membranes are moist.   Neck:  supple no lymphadenopathy noted Cardiovascular: Normal rate, regular rhythm. Heart sounds are normal Respiratory: Normal respiratory effort.  No retractions, lungs CTA GU: deferred Musculoskeletal: FROM all extremities, warm and well perfused Neurologic:  Normal speech and language.  Skin:  Skin is warm, dry and intact. No rash noted. Psychiatric: Mood and affect are normal. Speech and behavior are normal.  ____________________________________________   LABS (all labs ordered are listed, but only abnormal results are displayed)  Labs Reviewed  RESP PANEL BY RT-PCR (FLU A&B, COVID) [id number]    ____________________________________________   ____________________________________________  RADIOLOGY    ____________________________________________   PROCEDURES  Procedure(s) performed: No  Procedures    ____________________________________________   INITIAL IMPRESSION / ASSESSMENT AND PLAN / ED COURSE  Pertinent labs & imaging results that were available during my care of the patient were reviewed by me and considered in my medical decision making (see chart for details).   Patient is a 60 year old male who complains of URI symptoms.  Exam is consistent with covid or influenza.    Negative test for covid, negative influenza test  I did speak with the patient.  He is to take the Tamiflu once daily instead of twice daily due to the exposure.  He is to follow-up with regular doctor if not improving to 3 days.  Return emergency department for worsening.  States he understands.  He has been discharged in stable condition    Anthony Guerrero was evaluated in Emergency Department on 01/21/2021 for the symptoms described in the history of present illness. He was evaluated in the context of the global COVID-19 pandemic, which necessitated consideration that the patient might be at risk for infection with the SARS-CoV-2 virus that causes COVID-19. Institutional protocols and algorithms that pertain to the evaluation of patients at risk for COVID-19 are in a state of rapid change based on information released by regulatory bodies including the CDC and federal and state organizations. These policies and algorithms were followed during the patient's care in the ED.   As part of my medical decision making, I reviewed the following data within the [place] notes reviewed and incorporated, Labs reviewed , Old chart reviewed, Notes from prior ED visits and [place] Controlled Substance Database  ____________________________________________   FINAL CLINICAL  IMPRESSION(S) / ED DIAGNOSES  Final diagnoses:  Influenza  Suspected COVID-19 virus infection      NEW MEDICATIONS STARTED DURING THIS VISIT:  Discharge Medication List as of 01/21/2021  3:14 PM    START taking these medications   Details  oseltamivir (TAMIFLU) 75 MG capsule Take 1 capsule (75 mg total) by mouth 2 (two) times daily., Starting Wed 01/21/2021, Normal         Note:  This document was prepared using Dragon voice recognition software and may include unintentional dictation errors.    Versie Starks, PA-C 01/21/21 1711    Blake Divine, MD 01/22/21 [phone]

## 2021-01-21 NOTE — Discharge Instructions (Signed)
I will call you with your test results.  If your test result is positive for influenza you should take Tamiflu. If you have covid Tamiflu will not work.  You will need to quarantine for 5 days. Return emergency department worsening Follow-up with your regular doctor if not improving in 3 to 5 days Take Tylenol or ibuprofen for fever as needed.  Over-the-counter Mucinex for congestion

## 2021-02-18 ENCOUNTER — Emergency Department: Payer: Commercial Managed Care - PPO

## 2021-02-18 ENCOUNTER — Emergency Department
Admission: EM | Admit: 2021-02-18 | Discharge: 2021-02-18 | Disposition: A | Discharge status: Home / Self Care | Payer: Commercial Managed Care - PPO | Attending: Student in an Organized Health Care Education/Training Program | Admitting: Student in an Organized Health Care Education/Training Program

## 2021-02-18 ENCOUNTER — Other Ambulatory Visit: Payer: Self-pay

## 2021-02-18 DIAGNOSIS — L03115 Cellulitis of right lower limb: Secondary | ICD-10-CM | POA: Diagnosis not present

## 2021-02-18 DIAGNOSIS — Z23 Encounter for immunization: Secondary | ICD-10-CM | POA: Insufficient documentation

## 2021-02-18 DIAGNOSIS — S80211A Abrasion, right knee, initial encounter: Secondary | ICD-10-CM | POA: Diagnosis not present

## 2021-02-18 DIAGNOSIS — W450XXA Nail entering through skin, initial encounter: Secondary | ICD-10-CM | POA: Diagnosis not present

## 2021-02-18 DIAGNOSIS — L03119 Cellulitis of unspecified part of limb: Secondary | ICD-10-CM

## 2021-02-18 DIAGNOSIS — Y990 Civilian activity done for income or pay: Secondary | ICD-10-CM | POA: Diagnosis not present

## 2021-02-18 DIAGNOSIS — Z87891 Personal history of nicotine dependence: Secondary | ICD-10-CM | POA: Diagnosis not present

## 2021-02-18 DIAGNOSIS — S8991XA Unspecified injury of right lower leg, initial encounter: Secondary | ICD-10-CM | POA: Diagnosis present

## 2021-02-18 MED ORDER — TETANUS-DIPHTH-ACELL PERTUSSIS 5-2.5-18.5 LF-MCG/0.5 IM SUSY
0.5000 mL | PREFILLED_SYRINGE | Freq: Once | INTRAMUSCULAR | Status: AC
  Administered 2021-02-18: 0.5 mL via INTRAMUSCULAR
  Filled 2021-02-18: qty 0.5

## 2021-02-18 MED ORDER — DOXYCYCLINE HYCLATE 100 MG PO TABS: 100.0000 mg | ORAL_TABLET | Freq: Two times a day (BID) | ORAL | 0 refills | Status: AC

## 2021-02-18 MED ORDER — CEPHALEXIN 500 MG PO CAPS: 500.0000 mg | ORAL_CAPSULE | Freq: Four times a day (QID) | ORAL | 0 refills | Status: AC

## 2021-02-18 MED ORDER — CEPHALEXIN 500 MG PO CAPS
500.0000 mg | ORAL_CAPSULE | Freq: Once | ORAL | Status: AC
  Administered 2021-02-18: 500 mg via ORAL
  Filled 2021-02-18: qty 1

## 2021-02-18 MED ORDER — DOXYCYCLINE HYCLATE 100 MG PO TABS
100.0000 mg | ORAL_TABLET | Freq: Once | ORAL | Status: AC
  Administered 2021-02-18: 100 mg via ORAL
  Filled 2021-02-18: qty 1

## 2021-02-18 NOTE — Discharge Instructions (Addendum)
Take Keflex 4 times a day for the next 7 days. Take doxycycline twice daily for the next 7 days If range of motion or pain increases, please return to the emergency department for reevaluation as we would be concerned about a deeper infection. Please make a follow-up appointment with orthopedics.

## 2021-02-18 NOTE — ED Provider Notes (Signed)
[facility]-EMERGENCY DEPARTMENT  ____________________________________________  Time seen: Approximately 7:31 PM  I have reviewed the triage vital signs and the nursing notes.   HISTORY  Chief Complaint Knee Pain   Historian Patient     HPI Anthony Guerrero is a 60 y.o. male presents to the emergency department with acute right knee pain.  Patient reports that on Sunday he had a mild abrasion sustained from a nail while doing work.  Patient states that he has had some very mild swelling and erythema since injury occurred.  Patient states that he can flex and extend knee easily but does have some pain when trying to crawl on his knees.  Patient denies fever and chills.  No prior history of a septic knee.  No calf pain or posterior knee pain.  He denies a history of gout.  No other alleviating measures have been attempted.   History reviewed. No pertinent past medical history.   Immunizations up to date:  Yes.     History reviewed. No pertinent past medical history.  There are no problems to display for this patient.   Past Surgical History:  Procedure Laterality Date  . FRACTURE SURGERY      Prior to Admission medications   Medication Sig Start Date End Date Taking? Authorizing Provider  cephALEXin (KEFLEX) 500 MG capsule Take 1 capsule (500 mg total) by mouth 4 (four) times daily for 7 days. 02/18/21 02/25/21 Yes Vallarie Mare M, PA-C  doxycycline (VIBRA-TABS) 100 MG tablet Take 1 tablet (100 mg total) by mouth 2 (two) times daily for 7 days. 02/18/21 02/25/21 Yes Vallarie Mare M, PA-C  augmented betamethasone dipropionate (DIPROLENE-AF) 0.05 % cream Apply topically 2 (two) times daily.    [provider]  fluticasone (FLONASE) 50 MCG/ACT nasal spray Place 1 spray into both nostrils 2 (two) times daily. 12/23/18   Cuthriell, Charline Bills, PA-C  ibuprofen (ADVIL) 600 MG tablet Take 1 tablet (600 mg total) by mouth every 8 (eight) hours as needed. 04/28/20   Sable Feil,  PA-C  latanoprost (XALATAN) 0.005 % ophthalmic solution Place 1 drop into both eyes at bedtime.    [provider]  oseltamivir (TAMIFLU) 75 MG capsule Take 1 capsule (75 mg total) by mouth 2 (two) times daily. 01/21/21   Fisher, Linden Dolin, PA-C  traMADol (ULTRAM) 50 MG tablet Take 1 tablet (50 mg total) by mouth every 6 (six) hours as needed for moderate pain. 04/28/20   Sable Feil, PA-C    Allergies Patient has no known allergies.  No family history on file.  Social History Social History   Tobacco Use  . Smoking status: Former Research scientist (life sciences)  . Smokeless tobacco: Never Used  . Tobacco comment: quit 26 years ago  Substance Use Topics  . Alcohol use: No  . Drug use: No     Review of Systems  Constitutional: No fever/chills Eyes:  No discharge ENT: No upper respiratory complaints. Respiratory: no cough. No SOB/ use of accessory muscles to breath Gastrointestinal:   No nausea, no vomiting.  No diarrhea.  No constipation. Musculoskeletal: Patient has right knee pain.  Skin: Negative for rash, abrasions, lacerations, ecchymosis.   ____________________________________________   PHYSICAL EXAM:  VITAL SIGNS: ED Triage Vitals  Enc Vitals Group     BP 02/18/21 1507 121/79     Pulse Rate 02/18/21 1507 68     Resp 02/18/21 1507 16     Temp 02/18/21 1507 97.7 F (36.5 C)     Temp  Source 02/18/21 1507 Oral     SpO2 02/18/21 1507 99 %     Weight 02/18/21 1508 187 lb (84.8 kg)     Height 02/18/21 1508 5\' 6"  (1.676 m)     Head Circumference --      Peak Flow --      Pain Score 02/18/21 1507 4     Pain Loc --      Pain Edu? --      Excl. in [place]? --      Constitutional: Alert and oriented. Well appearing and in no acute distress. Eyes: Conjunctivae are normal. PERRL. EOMI. Head: Atraumatic. Cardiovascular: Normal rate, regular rhythm. Normal S1 and S2.  Good peripheral circulation. Respiratory: Normal respiratory effort without tachypnea or retractions. Lungs CTAB. Good  air entry to the bases with no decreased or absent breath sounds Gastrointestinal: Bowel sounds x 4 quadrants. Soft and nontender to palpation. No guarding or rigidity. No distention. Musculoskeletal: Full range of motion to all extremities. No obvious deformities noted.  Right knee does not appear significantly effused.  Palpable dorsalis pedis pulse bilaterally and symmetrically. Neurologic:  Normal for age. No gross focal neurologic deficits are appreciated.  Skin: Patient has a small abrasion along anterior right knee.  There is some mild erythema.  No streaking. Psychiatric: Mood and affect are normal for age. Speech and behavior are normal.   ____________________________________________   LABS (all labs ordered are listed, but only abnormal results are displayed)  Labs Reviewed - No data to display ____________________________________________  EKG   ____________________________________________  RADIOLOGY Unk Pinto, personally viewed and evaluated these images (plain radiographs) as part of my medical decision making, as well as reviewing the written report by the radiologist.  DG Knee Complete 4 Views Right  Result Date: 02/18/2021 CLINICAL DATA:  Puncture wound, pain and mild swelling EXAM: RIGHT KNEE - COMPLETE 4+ VIEW COMPARISON:  None. FINDINGS: Frontal, bilateral oblique, and lateral views of the right knee are obtained. No fracture, subluxation, or dislocation. There is mild medial compartmental joint space narrowing. No joint effusion. Mild prepatellar soft tissue swelling. No radiopaque foreign body. IMPRESSION: 1. Mild medial compartmental osteoarthritis. 2. Prepatellar soft tissue edema. No fracture or radiopaque foreign body. Electronically Signed   By: Randa Ngo M.D.   On: 02/18/2021 17:47    ____________________________________________    PROCEDURES  Procedure(s) performed:     Procedures     Medications  doxycycline (VIBRA-TABS) tablet 100  mg (100 mg Oral Given 02/18/21 1817)  cephALEXin (KEFLEX) capsule 500 mg (500 mg Oral Given 02/18/21 1817)  Tdap (BOOSTRIX) injection 0.5 mL (0.5 mLs Intramuscular Given 02/18/21 1818)     ____________________________________________   INITIAL IMPRESSION / ASSESSMENT AND PLAN / ED COURSE  Pertinent labs & imaging results that were available during my care of the patient were reviewed by me and considered in my medical decision making (see chart for details).      Assessment and plan Right knee pain 60 year old male presents to the emergency department with acute right knee pain.  Patient states that on Sunday he sustained a small abrasion from a nail while outside working.  Vital signs are reassuring at triage.  On physical exam, right knee did not appear grossly effused.  He was able to flex and extend knee easily.  Differential diagnosis at this time includes cellulitis versus septic knee.  Given full range of motion, my suspicion for septic knee is low at this time.  We will start patient  on both Keflex and doxycycline and have patient closely followed by orthopedics.  Patient was given return precautions to return to the emergency department with worsening erythema or swelling.  Patient was also given a tetanus shot while in the emergency department.  Return precautions were given to return with new or worsening symptoms.  All patient questions were answered.     ____________________________________________  FINAL CLINICAL IMPRESSION(S) / ED DIAGNOSES  Final diagnoses:  Cellulitis of knee      NEW MEDICATIONS STARTED DURING THIS VISIT:  ED Discharge Orders         Ordered    cephALEXin (KEFLEX) 500 MG capsule  4 times daily        02/18/21 1813    doxycycline (VIBRA-TABS) 100 MG tablet  2 times daily        02/18/21 1813              This chart was dictated using voice recognition software/Dragon. Despite best efforts to proofread, errors can occur which can change  the meaning. Any change was purely unintentional.     Lannie Fields, PA-C 02/18/21 1937    Merlyn Lot, MD 02/18/21 [phone]

## 2021-02-18 NOTE — ED Triage Notes (Signed)
Pt states he was putting in some flooring on Sunday and a small nail stuck in to his right knee states he has been having pain and some mild swelling since.

## 2021-02-18 NOTE — ED Notes (Signed)
Pt verbalized understanding of d/c instructions at this time. Pt given opportunity to ask questions as needed. Prescriptions reviewed at this time. Pt ambulatory to ED lobby, NAD noted, RR even and unlabored, steady gait noted.

## 2021-04-16 ENCOUNTER — Encounter: Payer: Self-pay | Admitting: Emergency Medicine

## 2021-04-16 ENCOUNTER — Emergency Department
Admission: EM | Admit: 2021-04-16 | Discharge: 2021-04-16 | Disposition: A | Discharge status: Home / Self Care | Payer: Commercial Managed Care - PPO | Attending: Emergency Medicine | Admitting: Emergency Medicine

## 2021-04-16 ENCOUNTER — Emergency Department: Payer: Commercial Managed Care - PPO

## 2021-04-16 ENCOUNTER — Other Ambulatory Visit: Payer: Self-pay

## 2021-04-16 DIAGNOSIS — S39012A Strain of muscle, fascia and tendon of lower back, initial encounter: Secondary | ICD-10-CM | POA: Insufficient documentation

## 2021-04-16 DIAGNOSIS — M25512 Pain in left shoulder: Secondary | ICD-10-CM | POA: Insufficient documentation

## 2021-04-16 DIAGNOSIS — R519 Headache, unspecified: Secondary | ICD-10-CM | POA: Insufficient documentation

## 2021-04-16 DIAGNOSIS — Z87891 Personal history of nicotine dependence: Secondary | ICD-10-CM | POA: Insufficient documentation

## 2021-04-16 DIAGNOSIS — S34109A Unspecified injury to unspecified level of lumbar spinal cord, initial encounter: Secondary | ICD-10-CM | POA: Diagnosis present

## 2021-04-16 DIAGNOSIS — Y92410 Unspecified street and highway as the place of occurrence of the external cause: Secondary | ICD-10-CM | POA: Insufficient documentation

## 2021-04-16 DIAGNOSIS — S161XXA Strain of muscle, fascia and tendon at neck level, initial encounter: Secondary | ICD-10-CM | POA: Diagnosis not present

## 2021-04-16 MED ORDER — METHOCARBAMOL 500 MG PO TABS: 500.0000 mg | ORAL_TABLET | Freq: Four times a day (QID) | ORAL | 0 refills | Status: DC | PRN

## 2021-04-16 MED ORDER — HYDROCODONE-ACETAMINOPHEN 5-325 MG PO TABS: 1.0000 | ORAL_TABLET | Freq: Four times a day (QID) | ORAL | 0 refills | Status: AC | PRN

## 2021-04-16 MED ORDER — HYDROCODONE-ACETAMINOPHEN 5-325 MG PO TABS
1.0000 | ORAL_TABLET | Freq: Once | ORAL | Status: AC
  Administered 2021-04-16: 1 via ORAL
  Filled 2021-04-16: qty 1
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
## 2021-04-16 NOTE — Discharge Instructions (Addendum)
Follow-up with your primary care provider if any continued problems or concerns.  Take medication only as directed.  The hydrocodone is for pain and should not be taken while you are driving or operating machinery as well as the methocarbamol which is every 6 hours for muscle spasms.  You may continue to take your gabapentin and naproxen with these medications.  Also ice or heat as needed for discomfort.
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
## 2021-04-22 DIAGNOSIS — M5030 Other cervical disc degeneration, unspecified cervical region: Secondary | ICD-10-CM | POA: Insufficient documentation

## 2021-04-25 ENCOUNTER — Emergency Department: Payer: Commercial Managed Care - PPO

## 2021-04-25 ENCOUNTER — Other Ambulatory Visit: Payer: Self-pay

## 2021-04-25 ENCOUNTER — Emergency Department
Admission: EM | Admit: 2021-04-25 | Discharge: 2021-04-25 | Disposition: A | Discharge status: Home / Self Care | Payer: Commercial Managed Care - PPO | Attending: Emergency Medicine | Admitting: Emergency Medicine

## 2021-04-25 DIAGNOSIS — Y92410 Unspecified street and highway as the place of occurrence of the external cause: Secondary | ICD-10-CM | POA: Diagnosis not present

## 2021-04-25 DIAGNOSIS — Z87891 Personal history of nicotine dependence: Secondary | ICD-10-CM | POA: Diagnosis not present

## 2021-04-25 DIAGNOSIS — S3992XA Unspecified injury of lower back, initial encounter: Secondary | ICD-10-CM | POA: Diagnosis present

## 2021-04-25 DIAGNOSIS — S39012A Strain of muscle, fascia and tendon of lower back, initial encounter: Secondary | ICD-10-CM | POA: Diagnosis not present

## 2021-04-25 MED ORDER — LIDOCAINE 5 % EX PTCH: 1.0000 | MEDICATED_PATCH | Freq: Two times a day (BID) | CUTANEOUS | 0 refills | Status: AC

## 2021-04-25 MED ORDER — LIDOCAINE 5 % EX PTCH
1.0000 | MEDICATED_PATCH | CUTANEOUS | Status: DC
  Administered 2021-04-25: 1 via TRANSDERMAL
  Filled 2021-04-25: qty 1
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
## 2021-04-25 NOTE — Discharge Instructions (Addendum)
Follow-up with your primary care doctor if any continued problems.  A prescription for Lidoderm patch was sent to the pharmacy.  This is same patch that was applied by the nurse while you are in the emergency department.  This patch is to help with your back pain and does not cause drowsiness.  Also as we discussed increase your gabapentin at bedtime to 2 tablets instead of 1 which should help with your back pain as well as help you rest at night.

## 2021-06-17 ENCOUNTER — Other Ambulatory Visit: Payer: Self-pay | Admitting: Physical Medicine & Rehabilitation

## 2021-06-17 DIAGNOSIS — M5441 Lumbago with sciatica, right side: Secondary | ICD-10-CM

## 2021-06-17 DIAGNOSIS — M542 Cervicalgia: Secondary | ICD-10-CM

## 2021-06-25 ENCOUNTER — Other Ambulatory Visit: Payer: Self-pay

## 2021-06-25 ENCOUNTER — Ambulatory Visit
Admission: RE | Admit: 2021-06-25 | Discharge: 2021-06-25 | Disposition: A | Discharge status: Home / Self Care | Payer: Commercial Managed Care - PPO | Source: Ambulatory Visit | Attending: Physical Medicine & Rehabilitation | Admitting: Physical Medicine & Rehabilitation

## 2021-06-25 DIAGNOSIS — M542 Cervicalgia: Secondary | ICD-10-CM | POA: Insufficient documentation

## 2021-06-25 DIAGNOSIS — M5441 Lumbago with sciatica, right side: Secondary | ICD-10-CM | POA: Diagnosis present

## 2021-07-15 ENCOUNTER — Other Ambulatory Visit: Payer: Self-pay | Admitting: Physical Medicine & Rehabilitation

## 2021-07-15 DIAGNOSIS — M542 Cervicalgia: Secondary | ICD-10-CM

## 2021-07-28 ENCOUNTER — Other Ambulatory Visit: Payer: Commercial Managed Care - PPO

## 2021-08-13 ENCOUNTER — Ambulatory Visit
Admission: RE | Admit: 2021-08-13 | Discharge: 2021-08-13 | Disposition: A | Discharge status: Home / Self Care | Payer: Commercial Managed Care - PPO | Source: Ambulatory Visit | Attending: Physical Medicine & Rehabilitation | Admitting: Physical Medicine & Rehabilitation

## 2021-08-13 DIAGNOSIS — M542 Cervicalgia: Secondary | ICD-10-CM

## 2021-08-13 MED ORDER — IOPAMIDOL (ISOVUE-M 300) INJECTION 61%
1.0000 mL | Freq: Once | INTRAMUSCULAR | Status: AC | PRN
  Administered 2021-08-13: 1 mL via EPIDURAL

## 2021-08-13 MED ORDER — TRIAMCINOLONE ACETONIDE 40 MG/ML IJ SUSP (RADIOLOGY)
60.0000 mg | Freq: Once | INTRAMUSCULAR | Status: AC
  Administered 2021-08-13: 60 mg via EPIDURAL

## 2021-08-13 NOTE — Discharge Instructions (Signed)

## 2021-12-24 ENCOUNTER — Other Ambulatory Visit: Payer: Self-pay | Admitting: Physical Medicine & Rehabilitation

## 2021-12-24 DIAGNOSIS — M5412 Radiculopathy, cervical region: Secondary | ICD-10-CM

## 2021-12-30 ENCOUNTER — Other Ambulatory Visit: Payer: Self-pay

## 2021-12-30 ENCOUNTER — Ambulatory Visit
Admission: RE | Admit: 2021-12-30 | Discharge: 2021-12-30 | Disposition: A | Discharge status: Home / Self Care | Payer: Commercial Managed Care - PPO | Source: Ambulatory Visit | Attending: Physical Medicine & Rehabilitation | Admitting: Physical Medicine & Rehabilitation

## 2021-12-30 DIAGNOSIS — M5412 Radiculopathy, cervical region: Secondary | ICD-10-CM

## 2021-12-30 MED ORDER — TRIAMCINOLONE ACETONIDE 40 MG/ML IJ SUSP (RADIOLOGY)
60.0000 mg | Freq: Once | INTRAMUSCULAR | Status: AC
  Administered 2021-12-30: 60 mg via EPIDURAL

## 2021-12-30 MED ORDER — IOPAMIDOL (ISOVUE-M 300) INJECTION 61%
1.0000 mL | Freq: Once | INTRAMUSCULAR | Status: AC
  Administered 2021-12-30: 1 mL via EPIDURAL

## 2021-12-30 NOTE — Discharge Instructions (Signed)

## 2022-06-01 ENCOUNTER — Other Ambulatory Visit: Payer: Self-pay | Admitting: Physical Medicine & Rehabilitation

## 2022-06-01 DIAGNOSIS — M5412 Radiculopathy, cervical region: Secondary | ICD-10-CM

## 2022-07-30 ENCOUNTER — Ambulatory Visit
Admission: RE | Admit: 2022-07-30 | Discharge: 2022-07-30 | Disposition: A | Discharge status: Home / Self Care | Payer: BLUE CROSS/BLUE SHIELD | Source: Ambulatory Visit | Attending: Physical Medicine & Rehabilitation | Admitting: Physical Medicine & Rehabilitation

## 2022-07-30 DIAGNOSIS — M5412 Radiculopathy, cervical region: Secondary | ICD-10-CM

## 2022-07-30 MED ORDER — IOPAMIDOL (ISOVUE-M 300) INJECTION 61%
1.0000 mL | Freq: Once | INTRAMUSCULAR | Status: AC | PRN
  Administered 2022-07-30: 1 mL via EPIDURAL

## 2022-07-30 MED ORDER — TRIAMCINOLONE ACETONIDE 40 MG/ML IJ SUSP (RADIOLOGY)
60.0000 mg | Freq: Once | INTRAMUSCULAR | Status: AC
  Administered 2022-07-30: 60 mg via EPIDURAL

## 2022-07-30 NOTE — Discharge Instructions (Signed)

## 2022-12-08 ENCOUNTER — Other Ambulatory Visit: Payer: Self-pay

## 2022-12-08 ENCOUNTER — Other Ambulatory Visit: Payer: Self-pay | Admitting: Physical Medicine & Rehabilitation

## 2022-12-08 DIAGNOSIS — M5010 Cervical disc disorder with radiculopathy, unspecified cervical region: Secondary | ICD-10-CM

## 2022-12-13 NOTE — Discharge Instructions (Signed)

## 2022-12-14 ENCOUNTER — Ambulatory Visit
Admission: RE | Admit: 2022-12-14 | Discharge: 2022-12-14 | Disposition: A | Discharge status: Home / Self Care | Payer: BLUE CROSS/BLUE SHIELD | Source: Ambulatory Visit | Attending: Physical Medicine & Rehabilitation | Admitting: Physical Medicine & Rehabilitation

## 2022-12-14 DIAGNOSIS — M5010 Cervical disc disorder with radiculopathy, unspecified cervical region: Secondary | ICD-10-CM

## 2022-12-14 MED ORDER — TRIAMCINOLONE ACETONIDE 40 MG/ML IJ SUSP (RADIOLOGY)
60.0000 mg | Freq: Once | INTRAMUSCULAR | Status: AC
  Administered 2022-12-14: 60 mg via EPIDURAL

## 2022-12-14 MED ORDER — IOPAMIDOL (ISOVUE-M 300) INJECTION 61%
1.0000 mL | Freq: Once | INTRAMUSCULAR | Status: AC
  Administered 2022-12-14: 1 mL via EPIDURAL

## 2022-12-28 ENCOUNTER — Encounter: Payer: Self-pay | Admitting: Student in an Organized Health Care Education/Training Program

## 2022-12-28 ENCOUNTER — Ambulatory Visit
Payer: BLUE CROSS/BLUE SHIELD | Attending: Student in an Organized Health Care Education/Training Program | Admitting: Student in an Organized Health Care Education/Training Program

## 2022-12-28 VITALS — BP 145/86 | HR 84 | Temp 97.2°F | Ht 66.0 in | Wt 167.0 lb

## 2022-12-28 DIAGNOSIS — M5134 Other intervertebral disc degeneration, thoracic region: Secondary | ICD-10-CM | POA: Insufficient documentation

## 2022-12-28 DIAGNOSIS — M5412 Radiculopathy, cervical region: Secondary | ICD-10-CM

## 2022-12-28 DIAGNOSIS — M4726 Other spondylosis with radiculopathy, lumbar region: Secondary | ICD-10-CM | POA: Insufficient documentation

## 2022-12-28 DIAGNOSIS — G894 Chronic pain syndrome: Secondary | ICD-10-CM | POA: Diagnosis not present

## 2022-12-28 DIAGNOSIS — M546 Pain in thoracic spine: Secondary | ICD-10-CM | POA: Insufficient documentation

## 2022-12-28 DIAGNOSIS — M47816 Spondylosis without myelopathy or radiculopathy, lumbar region: Secondary | ICD-10-CM | POA: Diagnosis present

## 2022-12-28 DIAGNOSIS — M5416 Radiculopathy, lumbar region: Secondary | ICD-10-CM | POA: Diagnosis present

## 2022-12-28 DIAGNOSIS — M5116 Intervertebral disc disorders with radiculopathy, lumbar region: Secondary | ICD-10-CM | POA: Diagnosis present

## 2022-12-28 DIAGNOSIS — G8929 Other chronic pain: Secondary | ICD-10-CM | POA: Diagnosis present

## 2022-12-28 NOTE — Patient Instructions (Signed)
Do not eat or drink for six hours before your procedure. You will need a driver due sedation.    Post-Procedure Discharge Instructions  Instructions: Apply ice:  Purpose: This will minimize any swelling and discomfort after procedure.  When: Day of procedure, as soon as you get home. How: Fill a plastic sandwich bag with crushed ice. Cover it with a small towel and apply to injection site. How long: (15 min on, 15 min off) Apply for 15 minutes then remove x 15 minutes.  Repeat sequence on day of procedure, until you go to bed. Apply heat:  Purpose: To treat any soreness and discomfort from the procedure. When: Starting the next day after the procedure. How: Apply heat to procedure site starting the day following the procedure. How long: May continue to repeat daily, until discomfort goes away. Food intake: Start with clear liquids (like water) and advance to regular food, as tolerated.  Physical activities: Keep activities to a minimum for the first 8 hours after the procedure. After that, then as tolerated. Driving: If you have received any sedation, be responsible and do not drive. You are not allowed to drive for 24 hours after having sedation. Blood thinner: (Applies only to those taking blood thinners) You may restart your blood thinner 6 hours after your procedure. Insulin: (Applies only to Diabetic patients taking insulin) As soon as you can eat, you may resume your normal dosing schedule. Infection prevention: Keep procedure site clean and dry. Shower daily and clean area with soap and water. Post-procedure Pain Diary: Extremely important that this be done correctly and accurately. Recorded information will be used to determine the next step in treatment. For the purpose of accuracy, follow these rules: Evaluate only the area treated. Do not report or include pain from an untreated area. For the purpose of this evaluation, ignore all other areas of pain, except for the treated  area. After your procedure, avoid taking a long nap and attempting to complete the pain diary after you wake up. Instead, set your alarm clock to go off every hour, on the hour, for the initial 8 hours after the procedure. Document the duration of the numbing medicine, and the relief you are getting from it. Do not go to sleep and attempt to complete it later. It will not be accurate. If you received sedation, it is likely that you were given a medication that may cause amnesia. Because of this, completing the diary at a later time may cause the information to be inaccurate. This information is needed to plan your care. Follow-up appointment: Keep your post-procedure follow-up evaluation appointment after the procedure (usually 2 weeks for most procedures, 6 weeks for radiofrequencies). DO NOT FORGET to bring you pain diary with you.   Expect: (What should I expect to see with my procedure?) From numbing medicine (AKA: Local Anesthetics): Numbness or decrease in pain. You may also experience some weakness, which if present, could last for the duration of the local anesthetic. Onset: Full effect within 15 minutes of injected. Duration: It will depend on the type of local anesthetic used. On the average, 1 to 8 hours.  From steroids (Applies only if steroids were used): Decrease in swelling or inflammation. Once inflammation is improved, relief of the pain will follow. Onset of benefits: Depends on the amount of swelling present. The more swelling, the longer it will take for the benefits to be seen. In some cases, up to 10 days. Duration: Steroids will stay in the system  x 2 weeks. Duration of benefits will depend on multiple posibilities including persistent irritating factors. Side-effects: If present, they may typically last 2 weeks (the duration of the steroids). Frequent: Cramps (if they occur, drink Gatorade and take over-the-counter Magnesium 450-500 mg once to twice a day); water retention with  temporary weight gain; increases in blood sugar; decreased immune system response; increased appetite. Occasional: Facial flushing (red, warm cheeks); mood swings; menstrual changes. Uncommon: Long-term decrease or suppression of natural hormones; bone thinning. (These are more common with higher doses or more frequent use. This is why we prefer that our patients avoid having any injection therapies in other practices.)  Very Rare: Severe mood changes; psychosis; aseptic necrosis. From procedure: Some discomfort is to be expected once the numbing medicine wears off. This should be minimal if ice and heat are applied as instructed.  Call if: (When should I call?) You experience numbness and weakness that gets worse with time, as opposed to wearing off. New onset bowel or bladder incontinence. (Applies only to procedures done in the spine)  Emergency Numbers: Durning business hours (Monday - Thursday, 8:00 AM - 4:00 PM) (Friday, 9:00 AM - 12:00 Noon): [phone] After hours: [phone] NOTE: If you are having a problem and are unable connect with, or to talk to a provider, then go to your nearest urgent care or emergency department. If the problem is serious and urgent, please call 911. ___________________________________________________________

## 2022-12-28 NOTE — Progress Notes (Signed)
Safety precautions to be maintained throughout the outpatient stay will include: orient to surroundings, keep bed in low position, maintain call bell within reach at all times, provide assistance with transfer out of bed and ambulation.  

## 2022-12-28 NOTE — Progress Notes (Signed)
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 deferred Tinel's test: deferred Apley's scratch test (touch opposite shoulder):  Action 1 (Across chest): deferred Action 2 (Overhead): deferred Action 3 (LB reach): deferred    Thoracic Spine Area Exam  Skin & Axial Inspection: No masses, redness, or swelling Alignment: Symmetrical Functional ROM: Unrestricted ROM Stability: No instability detected Muscle Tone/Strength: Functionally intact. No obvious neuro-muscular anomalies detected. Sensory (Neurological): Musculoskeletal pain pattern Muscle strength & Tone: No palpable anomalies Lumbar Spine Area Exam  Skin & Axial Inspection: No masses, redness, or swelling Alignment: Symmetrical Functional ROM: Decreased ROM affecting primarily the  right Stability: No instability detected Muscle Tone/Strength: Functionally intact. No obvious neuro-muscular anomalies detected. Sensory (Neurological): Dermatomal pain pattern right L5 and S1 Palpation: No palpable anomalies       Provocative Tests: Hyperextension/rotation test: deferred today       Lumbar quadrant test (Kemp's test): (+) on the right for foraminal stenosis Lateral bending test: (+) ipsilateral radicular pain, on the right. Positive for right-sided foraminal stenosis.  Gait & Posture Assessment  Ambulation: Unassisted Gait: Relatively normal for age and body habitus Posture: WNL  Lower Extremity Exam    Side: Right lower extremity  Side: Left lower extremity  Stability: No instability observed          Stability: No instability observed          Skin & Extremity Inspection: Skin color, temperature, and hair growth are WNL. No peripheral edema or cyanosis. No masses, redness, swelling, asymmetry, or associated skin lesions. No contractures.  Skin & Extremity Inspection: Skin color, temperature, and hair growth are WNL. No peripheral edema or cyanosis. No masses, redness, swelling, asymmetry, or associated skin lesions. No contractures.  Functional ROM: Unrestricted ROM                  Functional ROM: Unrestricted ROM                  Muscle Tone/Strength: Functionally intact. No obvious neuro-muscular anomalies detected.  Muscle Tone/Strength: Functionally intact. No obvious neuro-muscular anomalies detected.  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  Diagnoses of Chronic radicular lumbar pain, Lumbar radiculopathy, Cervical radicular pain, Lumbar spondylosis, Other intervertebral disc degeneration, thoracic region, Chronic  pain syndrome, and Chronic midline thoracic back pain were also pertinent to this visit.  Visit Diagnosis (New problems to examiner): 1. Lumbar disc herniation with radiculopathy   2. Chronic radicular lumbar pain   3. Lumbar radiculopathy   4. Cervical radicular pain   5. Lumbar spondylosis   6. Other intervertebral disc degeneration, thoracic region   7. Chronic pain syndrome   8. Chronic midline thoracic back pain    Plan of Care (Initial workup plan)  I discussed spinal cord stimulation with the patient extensively.  I used a spine model to explain how percutaneous leads will be placed and where the IPG would be taped to his lumbar region.  He has had transforaminal injections as well as medial branch nerve blocks and subsequently RFA with limited response.  He has not tried an interlaminar epidural injection at L5-S1 and I informed him that this is another option that he could consider before a spinal cord stimulator trial.  He states that he got more pain relief with his cervical injection which was done via an interlaminar approach than his lumbar spinal injections.  His cervical injections were done at [facility] radiology.  I informed him that I could also help him out with that injection here.  I will need an updated thoracic and lumbar MRI as well as a psych evaluation for spinal cord stimulator trial workup.  Once that is completed, we can place order for a SCS trial.  In the interim, patient would like to move forward with a right L5-S1 interlaminar epidural steroid injection.  Risk and benefits reviewed.  I encouraged the patient to continue with home exercise program.  Imaging Orders         MR LUMBAR SPINE WO CONTRAST         MR THORACIC SPINE WO CONTRAST      Procedure Orders         Lumbar Epidural  Injection     Provider-requested follow-up: Return in about 2 weeks (around 01/11/2023) for L5/S1 ESI, in clinic (PO Valium).  No future appointments.  Duration of encounter: 63mnutes.  Total time on encounter, as per AMA guidelines included both the face-to-face and non-face-to-face time personally spent by the physician and/or other qualified health care professional(s) on the day of the encounter (includes time in activities that require the physician or other qualified health care professional and does not include time in activities normally performed by clinical staff). Physician's time may include the following activities when performed: Preparing to see the patient (e.g., pre-charting review of records, searching for previously ordered imaging, lab work, and nerve conduction tests) Review of prior analgesic pharmacotherapies. Reviewing PMP Interpreting ordered tests (e.g., lab work, imaging, nerve conduction tests) Performing post-procedure evaluations, including interpretation of diagnostic procedures Obtaining and/or reviewing separately obtained history Performing a medically appropriate examination and/or evaluation Counseling and educating the patient/family/caregiver Ordering medications, tests, or procedures Referring and communicating with other health care professionals (when not separately reported) Documenting clinical information in the electronic or other health record Independently interpreting results (not separately reported) and communicating results to the patient/ family/caregiver Care coordination (not separately reported)  Note by: BGillis Santa MD (TTS technology used. I apologize for any typographical errors that were not detected and corrected.) Date: 12/28/2022; Time: 10:22 AM

## 2023-01-27 ENCOUNTER — Telehealth: Payer: Self-pay

## 2023-01-27 DIAGNOSIS — M5416 Radiculopathy, lumbar region: Secondary | ICD-10-CM

## 2023-01-27 DIAGNOSIS — M47816 Spondylosis without myelopathy or radiculopathy, lumbar region: Secondary | ICD-10-CM

## 2023-01-27 DIAGNOSIS — M5412 Radiculopathy, cervical region: Secondary | ICD-10-CM

## 2023-01-27 DIAGNOSIS — G894 Chronic pain syndrome: Secondary | ICD-10-CM

## 2023-01-27 DIAGNOSIS — M5116 Intervertebral disc disorders with radiculopathy, lumbar region: Secondary | ICD-10-CM

## 2023-01-27 DIAGNOSIS — G8929 Other chronic pain: Secondary | ICD-10-CM

## 2023-01-27 DIAGNOSIS — M5134 Other intervertebral disc degeneration, thoracic region: Secondary | ICD-10-CM

## 2023-01-27 NOTE — Telephone Encounter (Signed)
We will need new orders for both MRI's, they have expired because the patient never called them back after being left a VM. The authorization expires 5/13

## 2023-02-04 ENCOUNTER — Ambulatory Visit: Admission: RE | Admit: 2023-02-04 | Payer: BLUE CROSS/BLUE SHIELD | Source: Ambulatory Visit

## 2023-02-10 ENCOUNTER — Ambulatory Visit
Admission: RE | Admit: 2023-02-10 | Discharge: 2023-02-10 | Disposition: A | Discharge status: Home / Self Care | Payer: BLUE CROSS/BLUE SHIELD | Source: Ambulatory Visit | Attending: Student in an Organized Health Care Education/Training Program | Admitting: Student in an Organized Health Care Education/Training Program

## 2023-02-10 DIAGNOSIS — G894 Chronic pain syndrome: Secondary | ICD-10-CM

## 2023-02-10 DIAGNOSIS — M5416 Radiculopathy, lumbar region: Secondary | ICD-10-CM

## 2023-02-10 DIAGNOSIS — M5412 Radiculopathy, cervical region: Secondary | ICD-10-CM | POA: Diagnosis present

## 2023-02-10 DIAGNOSIS — M47816 Spondylosis without myelopathy or radiculopathy, lumbar region: Secondary | ICD-10-CM | POA: Diagnosis present

## 2023-02-10 DIAGNOSIS — M5116 Intervertebral disc disorders with radiculopathy, lumbar region: Secondary | ICD-10-CM

## 2023-02-10 DIAGNOSIS — M5134 Other intervertebral disc degeneration, thoracic region: Secondary | ICD-10-CM | POA: Diagnosis present

## 2023-02-10 DIAGNOSIS — G8929 Other chronic pain: Secondary | ICD-10-CM

## 2023-02-16 ENCOUNTER — Encounter: Payer: Self-pay | Admitting: Student in an Organized Health Care Education/Training Program

## 2023-02-16 ENCOUNTER — Ambulatory Visit
Admission: RE | Admit: 2023-02-16 | Discharge: 2023-02-16 | Disposition: A | Discharge status: Home / Self Care | Payer: BLUE CROSS/BLUE SHIELD | Source: Ambulatory Visit | Attending: Student in an Organized Health Care Education/Training Program | Admitting: Student in an Organized Health Care Education/Training Program

## 2023-02-16 ENCOUNTER — Ambulatory Visit
Payer: BLUE CROSS/BLUE SHIELD | Attending: Student in an Organized Health Care Education/Training Program | Admitting: Student in an Organized Health Care Education/Training Program

## 2023-02-16 VITALS — BP 163/89 | HR 63 | Temp 98.4°F | Resp 18 | Ht 66.0 in | Wt 167.0 lb

## 2023-02-16 DIAGNOSIS — M5116 Intervertebral disc disorders with radiculopathy, lumbar region: Secondary | ICD-10-CM | POA: Insufficient documentation

## 2023-02-16 DIAGNOSIS — M5416 Radiculopathy, lumbar region: Secondary | ICD-10-CM | POA: Diagnosis present

## 2023-02-16 DIAGNOSIS — G8929 Other chronic pain: Secondary | ICD-10-CM | POA: Insufficient documentation

## 2023-02-16 MED ORDER — DEXAMETHASONE SODIUM PHOSPHATE 10 MG/ML IJ SOLN
INTRAMUSCULAR | Status: AC
  Filled 2023-02-16: qty 1

## 2023-02-16 MED ORDER — LIDOCAINE HCL (PF) 2 % IJ SOLN
INTRAMUSCULAR | Status: AC
  Filled 2023-02-16: qty 10

## 2023-02-16 MED ORDER — SODIUM CHLORIDE (PF) 0.9 % IJ SOLN
INTRAMUSCULAR | Status: AC
  Filled 2023-02-16: qty 10

## 2023-02-16 MED ORDER — ROPIVACAINE HCL 2 MG/ML IJ SOLN
INTRAMUSCULAR | Status: AC
  Filled 2023-02-16: qty 20

## 2023-02-16 MED ORDER — IOHEXOL 180 MG/ML  SOLN
INTRAMUSCULAR | Status: AC
  Filled 2023-02-16: qty 20

## 2023-02-16 MED ORDER — LIDOCAINE HCL 2 % IJ SOLN
20.0000 mL | Freq: Once | INTRAMUSCULAR | Status: AC
  Administered 2023-02-16: 200 mg

## 2023-02-16 MED ORDER — SODIUM CHLORIDE 0.9% FLUSH
2.0000 mL | Freq: Once | INTRAVENOUS | Status: AC
  Administered 2023-02-16: 2 mL

## 2023-02-16 MED ORDER — DEXAMETHASONE SODIUM PHOSPHATE 10 MG/ML IJ SOLN
10.0000 mg | Freq: Once | INTRAMUSCULAR | Status: AC
  Administered 2023-02-16: 10 mg

## 2023-02-16 MED ORDER — ROPIVACAINE HCL 2 MG/ML IJ SOLN
2.0000 mL | Freq: Once | INTRAMUSCULAR | Status: AC
  Administered 2023-02-16: 2 mL via EPIDURAL

## 2023-02-16 MED ORDER — IOHEXOL 180 MG/ML  SOLN
10.0000 mL | Freq: Once | INTRAMUSCULAR | Status: AC
  Administered 2023-02-16: 10 mL via EPIDURAL

## 2023-02-16 NOTE — Progress Notes (Signed)
Safety precautions to be maintained throughout the outpatient stay will include: orient to surroundings, keep bed in low position, maintain call bell within reach at all times, provide assistance with transfer out of bed and ambulation.  

## 2023-02-16 NOTE — Progress Notes (Signed)
PROVIDER NOTE: Interpretation of information contained herein should be left to medically-trained personnel. Specific patient instructions are provided elsewhere under "Patient Instructions" section of medical record. This document was created in part using STT-dictation technology, any transcriptional errors that may result from this process are unintentional.  Patient: Anthony Guerrero Type: Established DOB: 1961/02/22 MRN: [id number] PCP: Jerl Mina, MD  Service: Procedure DOS: 02/16/2023 Setting: Ambulatory Location: Ambulatory outpatient facility Delivery: Face-to-face Provider: Edward Jolly, MD Specialty: Interventional Pain Management Specialty designation: 09 Location: Outpatient facility Ref. Prov.: Jerl Mina, MD       Interventional Therapy   Procedure: Lumbar epidural steroid injection (LESI) (interlaminar) #1    Laterality: Right   Level:  L5-S1 Level.  Imaging: Fluoroscopic guidance         Anesthesia: Local anesthesia (1-2% Lidocaine) DOS: 02/16/2023  Performed by: Edward Jolly, MD  Purpose: Diagnostic/Therapeutic Indications: Lumbar radicular pain of intraspinal etiology of more than 4 weeks that has failed to respond to conservative therapy and is severe enough to impact quality of life or function. 1. Lumbar disc herniation with radiculopathy   2. Chronic radicular lumbar pain   3. Lumbar radiculopathy    NAS-11 Pain score:   Pre-procedure: 8 /10   Post-procedure: 6 /10      Position / Prep / Materials:  Position: Prone w/ head of the table raised (slight reverse trendelenburg) to facilitate breathing.  Prep solution: DuraPrep (Iodine Povacrylex [0.7% available iodine] and Isopropyl Alcohol, 74% w/w) Prep Area: Entire Posterior Lumbar Region from lower scapular tip down to mid buttocks area and from flank to flank. Materials:  Tray: Epidural tray Needle(s):  Type: Epidural needle (Tuohy) Gauge (G):  22 Length: Regular (3.5-in) Qty: 1   Pre-op  H&P Assessment:  Mr. Heide is a 62 y.o. (year old), male patient, seen today for interventional treatment. He  has a past surgical history that includes Fracture surgery. Mr. Guymon has a current medication list which includes the following prescription(s): celecoxib, gabapentin, latanoprost, lidocaine, augmented betamethasone dipropionate, fluticasone, and methocarbamol. His primarily concern today is the Back Pain (lower)  Initial Vital Signs:  Pulse/HCG Rate: 63ECG Heart Rate: 74 Temp: 98.4 F (36.9 C) Resp: 17 BP: 126/84 SpO2: 100 %  BMI: Estimated body mass index is 26.95 kg/m as calculated from the following:   Height as of this encounter: 5\' 6"  (1.676 m).   Weight as of this encounter: 167 lb (75.8 kg).  Risk Assessment: Allergies: Reviewed. He has No Known Allergies.  Allergy Precautions: None required Coagulopathies: Reviewed. None identified.  Blood-thinner therapy: None at this time Active Infection(s): Reviewed. None identified. Mr. Wahlen is afebrile  Site Confirmation: Mr. Lanphere was asked to confirm the procedure and laterality before marking the site Procedure checklist: Completed Consent: Before the procedure and under the influence of no sedative(s), amnesic(s), or anxiolytics, the patient was informed of the treatment options, risks and possible complications. To fulfill our ethical and legal obligations, as recommended by the American Medical Association's Code of Ethics, I have informed the patient of my clinical impression; the nature and purpose of the treatment or procedure; the risks, benefits, and possible complications of the intervention; the alternatives, including doing nothing; the risk(s) and benefit(s) of the alternative treatment(s) or procedure(s); and the risk(s) and benefit(s) of doing nothing. The patient was provided information about the general risks and possible complications associated with the procedure. These may include, but are not  limited to: failure to achieve desired goals, infection, bleeding, organ or nerve  damage, allergic reactions, paralysis, and death. In addition, the patient was informed of those risks and complications associated to Spine-related procedures, such as failure to decrease pain; infection (i.e.: Meningitis, epidural or intraspinal abscess); bleeding (i.e.: epidural hematoma, subarachnoid hemorrhage, or any other type of intraspinal or peri-dural bleeding); organ or nerve damage (i.e.: Any type of peripheral nerve, nerve root, or spinal cord injury) with subsequent damage to sensory, motor, and/or autonomic systems, resulting in permanent pain, numbness, and/or weakness of one or several areas of the body; allergic reactions; (i.e.: anaphylactic reaction); and/or death. Furthermore, the patient was informed of those risks and complications associated with the medications. These include, but are not limited to: allergic reactions (i.e.: anaphylactic or anaphylactoid reaction(s)); adrenal axis suppression; blood sugar elevation that in diabetics may result in ketoacidosis or comma; water retention that in patients with history of congestive heart failure may result in shortness of breath, pulmonary edema, and decompensation with resultant heart failure; weight gain; swelling or edema; medication-induced neural toxicity; particulate matter embolism and blood vessel occlusion with resultant organ, and/or nervous system infarction; and/or aseptic necrosis of one or more joints. Finally, the patient was informed that Medicine is not an exact science; therefore, there is also the possibility of unforeseen or unpredictable risks and/or possible complications that may result in a catastrophic outcome. The patient indicated having understood very clearly. We have given the patient no guarantees and we have made no promises. Enough time was given to the patient to ask questions, all of which were answered to the patient's  satisfaction. Mr. Blixt has indicated that he wanted to continue with the procedure. Attestation: I, the ordering provider, attest that I have discussed with the patient the benefits, risks, side-effects, alternatives, likelihood of achieving goals, and potential problems during recovery for the procedure that I have provided informed consent. Date  Time: 02/16/2023 11:04 AM   Pre-Procedure Preparation:  Monitoring: As per clinic protocol. Respiration, ETCO2, SpO2, BP, heart rate and rhythm monitor placed and checked for adequate function Safety Precautions: Patient was assessed for positional comfort and pressure points before starting the procedure. Time-out: I initiated and conducted the "Time-out" before starting the procedure, as per protocol. The patient was asked to participate by confirming the accuracy of the "Time Out" information. Verification of the correct person, site, and procedure were performed and confirmed by me, the nursing staff, and the patient. "Time-out" conducted as per Joint Commission's Universal Protocol (UP.01.01.01). Time: 1157 Start Time: 1157 hrs.  Description/Narrative of Procedure:          Target: Epidural space via interlaminar opening, initially targeting the lower laminar border of the superior vertebral body. Region: Lumbar Approach: Percutaneous paravertebral  Rationale (medical necessity): procedure needed and proper for the diagnosis and/or treatment of the patient's medical symptoms and needs. Procedural Technique Safety Precautions: Aspiration looking for blood return was conducted prior to all injections. At no point did we inject any substances, as a needle was being advanced. No attempts were made at seeking any paresthesias. Safe injection practices and needle disposal techniques used. Medications properly checked for expiration dates. SDV (single dose vial) medications used. Description of the Procedure: Protocol guidelines were followed. The  procedure needle was introduced through the skin, ipsilateral to the reported pain, and advanced to the target area. Bone was contacted and the needle walked caudad, until the lamina was cleared. The epidural space was identified using "loss-of-resistance technique" with 2-3 ml of PF-NaCl (0.9% NSS), in a 5cc LOR glass syringe.  6 cc solution made of 3cc of preservative-free saline, 2 cc of 0.2% ropivacaine, 1 cc of Decadron 10 mg/cc.   Vitals:   02/16/23 1121 02/16/23 1157 02/16/23 1200 02/16/23 1203  BP: 126/84 (!) 163/94 (!) 171/103 (!) 163/89  Pulse: 63     Resp: 17 20 (!) 21 18  Temp: 98.4 F (36.9 C)     TempSrc: Temporal     SpO2: 100% 100% 100% 100%  Weight: 167 lb (75.8 kg)     Height: 5\' 6"  (1.676 m)       Start Time: 1157 hrs. End Time: 1201 hrs.  Imaging Guidance (Spinal):          Type of Imaging Technique: Fluoroscopy Guidance (Spinal) Indication(s): Assistance in needle guidance and placement for procedures requiring needle placement in or near specific anatomical locations not easily accessible without such assistance. Exposure Time: Please see nurses notes. Contrast: Before injecting any contrast, we confirmed that the patient did not have an allergy to iodine, shellfish, or radiological contrast. Once satisfactory needle placement was completed at the desired level, radiological contrast was injected. Contrast injected under live fluoroscopy. No contrast complications. See chart for type and volume of contrast used. Fluoroscopic Guidance: I was personally present during the use of fluoroscopy. "Tunnel Vision Technique" used to obtain the best possible view of the target area. Parallax error corrected before commencing the procedure. "Direction-depth-direction" technique used to introduce the needle under continuous pulsed fluoroscopy. Once target was reached, antero-posterior, oblique, and lateral fluoroscopic projection used confirm needle placement in all planes. Images  permanently stored in EMR. Interpretation: I personally interpreted the imaging intraoperatively. Adequate needle placement confirmed in multiple planes. Appropriate spread of contrast into desired area was observed. No evidence of afferent or efferent intravascular uptake. No intrathecal or subarachnoid spread observed. Permanent images saved into the patient's record.  Antibiotic Prophylaxis:   Anti-infectives (From admission, onward)    None      Indication(s): None identified  Post-operative Assessment:  Post-procedure Vital Signs:  Pulse/HCG Rate: 6363 Temp: 98.4 F (36.9 C) Resp: 18 BP: (!) 163/89 SpO2: 100 %  EBL: None  Complications: No immediate post-treatment complications observed by team, or reported by patient.  Note: The patient tolerated the entire procedure well. A repeat set of vitals were taken after the procedure and the patient was kept under observation following institutional policy, for this type of procedure. Post-procedural neurological assessment was performed, showing return to baseline, prior to discharge. The patient was provided with post-procedure discharge instructions, including a section on how to identify potential problems. Should any problems arise concerning this procedure, the patient was given instructions to immediately contact us, at any time, without hesitation. In any case, we plan to contact the patient by telephone for a follow-up status report regarding this interventional procedure.  Comments:  No additional relevant information.  Plan of Care (POC)  Orders:  Orders Placed This Encounter  Procedures   DG PAIN CLINIC C-ARM 1-60 MIN NO REPORT    Intraoperative interpretation by procedural physician at [facility].    Standing Status:   Standing    Number of Occurrences:   1    Order Specific Question:   Reason for exam:    Answer:   Assistance in needle guidance and placement for procedures requiring needle placement in  or near specific anatomical locations not easily accessible without such assistance.    Medications ordered for procedure: Meds ordered this encounter  Medications   iohexol (OMNIPAQUE)  180 MG/ML injection 10 mL    Must be Myelogram-compatible. If not available, you may substitute with a water-soluble, non-ionic, hypoallergenic, myelogram-compatible radiological contrast medium.   lidocaine (XYLOCAINE) 2 % (with pres) injection 400 mg   sodium chloride flush (NS) 0.9 % injection 2 mL   ropivacaine (PF) 2 mg/mL (0.2%) (NAROPIN) injection 2 mL   dexamethasone (DECADRON) injection 10 mg   Medications administered: We administered iohexol, lidocaine, sodium chloride flush, ropivacaine (PF) 2 mg/mL (0.2%), and dexamethasone.  See the medical record for exact dosing, route, and time of administration.  Follow-up plan:   Return in about 20 days (around 03/08/2023) for Post Procedure Evaluation, discuss C-ESI, and SCS trial F2F.       Right L5-S1 ESI    Recent Visits Date Type Provider Dept  12/28/22 Office Visit Edward Jolly, MD [facility]  Showing recent visits within past 90 days and meeting all other requirements Today's Visits Date Type Provider Dept  02/16/23 Procedure visit Edward Jolly, MD [facility]  Showing today's visits and meeting all other requirements Future Appointments Date Type Provider Dept  03/08/23 Appointment Edward Jolly, MD [facility]  Showing future appointments within next 90 days and meeting all other requirements  Disposition: Discharge home  Discharge (Date  Time): 02/16/2023; 1207 hrs.   Primary Care Physician: Jerl Mina, MD Location: [facility] Note by: Edward Jolly, MD (TTS technology used. I apologize for any typographical errors that were not detected and corrected.) Date: 02/16/2023; Time: 12:18 PM  Disclaimer:  Medicine is not an Visual merchandiser. The only guarantee in medicine is  that nothing is guaranteed. It is important to note that the decision to proceed with this intervention was based on the information collected from the patient. The Data and conclusions were drawn from the patient's questionnaire, the interview, and the physical examination. Because the information was provided in large part by the patient, it cannot be guaranteed that it has not been purposely or unconsciously manipulated. Every effort has been made to obtain as much relevant data as possible for this evaluation. It is important to note that the conclusions that lead to this procedure are derived in large part from the available data. Always take into account that the treatment will also be dependent on availability of resources and existing treatment guidelines, considered by other Pain Management Practitioners as being common knowledge and practice, at the time of the intervention. For Medico-Legal purposes, it is also important to point out that variation in procedural techniques and pharmacological choices are the acceptable norm. The indications, contraindications, technique, and results of the above procedure should only be interpreted and judged by a Board-Certified Interventional Pain Specialist with extensive familiarity and expertise in the same exact procedure and technique.

## 2023-02-16 NOTE — Patient Instructions (Signed)

## 2023-02-17 ENCOUNTER — Telehealth: Payer: Self-pay

## 2023-02-17 NOTE — Telephone Encounter (Signed)
Post procedure fiollow up.  Patient states he is doing good.

## 2023-03-08 ENCOUNTER — Encounter: Payer: Self-pay | Admitting: Student in an Organized Health Care Education/Training Program

## 2023-03-08 ENCOUNTER — Ambulatory Visit
Payer: BLUE CROSS/BLUE SHIELD | Attending: Student in an Organized Health Care Education/Training Program | Admitting: Student in an Organized Health Care Education/Training Program

## 2023-03-08 VITALS — BP 127/88 | HR 69 | Temp 97.9°F | Ht 66.0 in | Wt 167.0 lb

## 2023-03-08 DIAGNOSIS — M5412 Radiculopathy, cervical region: Secondary | ICD-10-CM

## 2023-03-08 DIAGNOSIS — M5116 Intervertebral disc disorders with radiculopathy, lumbar region: Secondary | ICD-10-CM | POA: Diagnosis present

## 2023-03-08 DIAGNOSIS — M47816 Spondylosis without myelopathy or radiculopathy, lumbar region: Secondary | ICD-10-CM | POA: Diagnosis present

## 2023-03-08 DIAGNOSIS — G894 Chronic pain syndrome: Secondary | ICD-10-CM | POA: Insufficient documentation

## 2023-03-08 DIAGNOSIS — M5416 Radiculopathy, lumbar region: Secondary | ICD-10-CM | POA: Diagnosis present

## 2023-03-08 DIAGNOSIS — G8929 Other chronic pain: Secondary | ICD-10-CM | POA: Insufficient documentation

## 2023-03-08 MED ORDER — METHOCARBAMOL 750 MG PO TABS
750.0000 mg | ORAL_TABLET | Freq: Two times a day (BID) | ORAL | 1 refills | Status: DC | PRN
Start: 2023-03-08 — End: 2023-12-01

## 2023-03-08 NOTE — Patient Instructions (Addendum)
Clean back with solution night prior to trial We will give you [employer] brochure for [facility]  ______________________________________________________________________  Preparing for your procedure  Appointments: If you think you may not be able to keep your appointment, call 24-48 hours in advance to cancel. We need time to make it available to others.  During your procedure appointment there will be: No Prescription Refills. No disability issues to discussed. No medication changes or discussions.  Instructions: Food intake: Avoid eating anything solid for at least 86hours prior to your procedure. Clear liquid intake: You may take clear liquids such as water up to 2 hours prior to your procedure. (No carbonated drinks. No soda.) Transportation: Unless otherwise stated by your physician, bring a driver. Morning Medicines: Except for blood thinners, take all of your other morning medications with a sip of water. Make sure to take your heart and blood pressure medicines. If your blood pressure's lower number is above 100, the case will be rescheduled. Blood thinners: Make sure to stop your blood thinners as instructed.  If you take a blood thinner, but were not instructed to stop it, call our office [phone] and ask to talk to a nurse. Not stopping a blood thinner prior to certain procedures could lead to serious complications. Diabetics on insulin: Notify the staff so that you can be scheduled 1st case in the morning. If your diabetes requires high dose insulin, take only  of your normal insulin dose the morning of the procedure and notify the staff that you have done so. Preventing infections: Shower with an antibacterial soap the morning of your procedure.  Build-up your immune system: Take 1000 mg of Vitamin C with every meal (3 times a day) the day prior to your procedure. Antibiotics: Inform the nursing staff if you are taking any antibiotics or if you have any conditions that may  require antibiotics prior to procedures. (Example: recent joint implants)   Pregnancy: If you are pregnant make sure to notify the nursing staff. Not doing so may result in injury to the fetus, including death.  Sickness: If you have a cold, fever, or any active infections, call and cancel or reschedule your procedure. Receiving steroids while having an infection may result in complications. Arrival: You must be in the facility at least 30 minutes prior to your scheduled procedure. Tardiness: Your scheduled time is also the cutoff time. If you do not arrive at least 15 minutes prior to your procedure, you will be rescheduled.  Children: Do not bring any children with you. Make arrangements to keep them home. Dress appropriately: There is always a possibility that your clothing may get soiled. Avoid long dresses. Valuables: Do not bring any jewelry or valuables.  Reasons to call and reschedule or cancel your procedure: (Following these recommendations will minimize the risk of a serious complication.) Surgeries: Avoid having procedures within 2 weeks of any surgery. (Avoid for 2 weeks before or after any surgery). Flu Shots: Avoid having procedures within 2 weeks of a flu shots or . (Avoid for 2 weeks before or after immunizations). Barium: Avoid having a procedure within 7-10 days after having had a radiological study involving the use of radiological contrast. (Myelograms, Barium swallow or enema study). Heart attacks: Avoid any elective procedures or surgeries for the initial 6 months after a "Myocardial Infarction" (Heart Attack). Blood thinners: It is imperative that you stop these medications before procedures. Let us know if you if you take any blood thinner.  Infection: Avoid procedures during or  within two weeks of an infection (including chest colds or gastrointestinal problems). Symptoms associated with infections include: Localized redness, fever, chills, night sweats or profuse sweating,  burning sensation when voiding, cough, congestion, stuffiness, runny nose, sore throat, diarrhea, nausea, vomiting, cold or Flu symptoms, recent or current infections. It is specially important if the infection is over the area that we intend to treat. Heart and lung problems: Symptoms that may suggest an active cardiopulmonary problem include: cough, chest pain, breathing difficulties or shortness of breath, dizziness, ankle swelling, uncontrolled high or unusually low blood pressure, and/or palpitations. If you are experiencing any of these symptoms, cancel your procedure and contact your primary care physician for an evaluation.  Remember:  Regular Business hours are:  Monday to Thursday 8:00 AM to 4:00 PM  Provider's Schedule: Delano Metz, MD:  Procedure days: Tuesday and Thursday 7:30 AM to 4:00 PM  Edward Jolly, MD:  Procedure days: Monday and Wednesday 7:30 AM to 4:00 PM  ______________________________________________________________________

## 2023-03-08 NOTE — Progress Notes (Signed)
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 ESI     Recent Visits Date Type Provider Dept  02/16/23 Procedure visit Edward Jolly, MD [facility]  12/28/22 Office Visit Edward Jolly, MD [facility]  Showing recent visits within past 90 days and meeting all other requirements Today's Visits Date Type Provider Dept  03/08/23 Office Visit Edward Jolly, MD [facility]  Showing today's visits and meeting all other requirements Future Appointments Date Type Provider Dept  04/06/23 Appointment Edward Jolly, MD [facility]  Showing future appointments within next 90 days and meeting all other requirements  I discussed the assessment and treatment plan with the patient. The patient was provided an opportunity to ask questions and all were answered. The patient agreed with the plan and demonstrated an  understanding of the instructions.  Patient advised to call back or seek an in-person evaluation if the symptoms or condition worsens.  Duration of encounter: .  Total time on encounter, as per AMA guidelines included both the face-to-face and non-face-to-face time personally spent by the physician and/or other qualified health care professional(s) on the day of the encounter (includes time in activities that require the physician or other qualified health care professional and does not include time in activities normally performed by clinical staff). Physician's time may include the following activities when performed: Preparing to see the patient (e.g., pre-charting review of records, searching for previously ordered imaging,  lab work, and nerve conduction tests) Review of prior analgesic pharmacotherapies. Reviewing PMP Interpreting ordered tests (e.g., lab work, imaging, nerve conduction tests) Performing post-procedure evaluations, including interpretation of diagnostic procedures Obtaining and/or reviewing separately obtained history Performing a medically appropriate examination and/or evaluation Counseling and educating the patient/family/caregiver Ordering medications, tests, or procedures Referring and communicating with other health care professionals (when not separately reported) Documenting clinical information in the electronic or other health record Independently interpreting results (not separately reported) and communicating results to the patient/ family/caregiver Care coordination (not separately reported)  Note by: Edward Jolly, MD Date: 03/08/2023; Time: 3:49 PM

## 2023-03-30 ENCOUNTER — Ambulatory Visit: Payer: BLUE CROSS/BLUE SHIELD | Admitting: Student in an Organized Health Care Education/Training Program

## 2023-04-06 ENCOUNTER — Encounter: Payer: Self-pay | Admitting: Student in an Organized Health Care Education/Training Program

## 2023-04-06 ENCOUNTER — Ambulatory Visit
Payer: BLUE CROSS/BLUE SHIELD | Attending: Student in an Organized Health Care Education/Training Program | Admitting: Student in an Organized Health Care Education/Training Program

## 2023-04-06 ENCOUNTER — Ambulatory Visit
Admission: RE | Admit: 2023-04-06 | Discharge: 2023-04-06 | Disposition: A | Discharge status: Home / Self Care | Payer: BLUE CROSS/BLUE SHIELD | Source: Ambulatory Visit | Attending: Student in an Organized Health Care Education/Training Program | Admitting: Student in an Organized Health Care Education/Training Program

## 2023-04-06 VITALS — BP 121/87 | HR 84 | Temp 97.8°F | Resp 16 | Ht 66.0 in | Wt 167.0 lb

## 2023-04-06 DIAGNOSIS — M5412 Radiculopathy, cervical region: Secondary | ICD-10-CM | POA: Insufficient documentation

## 2023-04-06 MED ORDER — IOHEXOL 180 MG/ML  SOLN
10.0000 mL | Freq: Once | INTRAMUSCULAR | Status: AC
  Administered 2023-04-06: 10 mL via EPIDURAL
  Filled 2023-04-06: qty 20

## 2023-04-06 MED ORDER — ROPIVACAINE HCL 2 MG/ML IJ SOLN
1.0000 mL | Freq: Once | INTRAMUSCULAR | Status: AC
  Administered 2023-04-06: 1 mL via EPIDURAL
  Filled 2023-04-06: qty 20

## 2023-04-06 MED ORDER — SODIUM CHLORIDE 0.9% FLUSH
1.0000 mL | Freq: Once | INTRAVENOUS | Status: AC
  Administered 2023-04-06: 1 mL

## 2023-04-06 MED ORDER — DEXAMETHASONE SODIUM PHOSPHATE 10 MG/ML IJ SOLN
10.0000 mg | Freq: Once | INTRAMUSCULAR | Status: AC
  Administered 2023-04-06: 10 mg
  Filled 2023-04-06: qty 1

## 2023-04-06 MED ORDER — LIDOCAINE HCL 2 % IJ SOLN
20.0000 mL | Freq: Once | INTRAMUSCULAR | Status: AC
  Administered 2023-04-06: 400 mg
  Filled 2023-04-06: qty 20

## 2023-04-06 NOTE — Progress Notes (Signed)
Safety precautions to be maintained throughout the outpatient stay will include: orient to surroundings, keep bed in low position, maintain call bell within reach at all times, provide assistance with transfer out of bed and ambulation.  

## 2023-04-06 NOTE — Progress Notes (Signed)
PROVIDER NOTE: Interpretation of information contained herein should be left to medically-trained personnel. Specific patient instructions are provided elsewhere under "Patient Instructions" section of medical record. This document was created in part using STT-dictation technology, any transcriptional errors that may result from this process are unintentional.  Patient: Anthony Guerrero Type: Established DOB: November 19, 1960 MRN: [id number] PCP: Jerl Mina, MD  Service: Procedure DOS: 04/06/2023 Setting: Ambulatory Location: Ambulatory outpatient facility Delivery: Face-to-face Provider: Edward Jolly, MD Specialty: Interventional Pain Management Specialty designation: 09 Location: Outpatient facility Ref. Prov.: Jerl Mina, MD       Interventional Therapy   Procedure: Cervical Epidural Steroid injection (CESI) (Interlaminar) #1  Laterality: Right  Level: C7-T1 Imaging: Fluoroscopy-assisted DOS: 04/06/2023  Performed by: Edward Jolly, MD Anesthesia: Local anesthesia (1-2% Lidocaine)  Purpose: Diagnostic/Therapeutic Indications: Cervicalgia, cervical radicular pain, degenerative disc disease, severe enough to impact quality of life or function. 1. Cervical radicular pain    NAS-11 score:   Pre-procedure: 7 /10   Post-procedure: 7 /10      Position  Prep  Materials:  Location setting: Procedure suite Position: Prone, on modified reverse trendelenburg to facilitate breathing, with head in head-cradle. Pillows positioned under chest (below chin-level) with cervical spine flexed. Safety Precautions: Patient was assessed for positional comfort and pressure points before starting the procedure. Prepping solution: DuraPrep (Iodine Povacrylex [0.7% available iodine] and Isopropyl Alcohol, 74% w/w) Prep Area: Entire  cervicothoracic region Approach: percutaneous, paramedial Intended target: Posterior cervical epidural space Materials Procedure:  Tray: Epidural Needle(s): Epidural  (Tuohy) Qty: 1 Length: (90mm) 3.5-inch Gauge: 22G   Pre-op H&P Assessment:  Anthony Guerrero is a 62 y.o. (year old), male patient, seen today for interventional treatment. He  has a past surgical history that includes Fracture surgery. Anthony Guerrero has a current medication list which includes the following prescription(s): celecoxib, gabapentin, latanoprost, lidocaine, and methocarbamol. His primarily concern today is the Neck Pain  Initial Vital Signs:  Pulse/HCG Rate: 77  Temp: 97.8 F (36.6 C) Resp: 16 BP: 116/77 SpO2: 99 %  BMI: Estimated body mass index is 26.95 kg/m as calculated from the following:   Height as of this encounter: 5\' 6"  (1.676 m).   Weight as of this encounter: 167 lb (75.8 kg).  Risk Assessment: Allergies: Reviewed. He has No Known Allergies.  Allergy Precautions: None required Coagulopathies: Reviewed. None identified.  Blood-thinner therapy: None at this time Active Infection(s): Reviewed. None identified. Anthony Guerrero is afebrile  Site Confirmation: Anthony Guerrero was asked to confirm the procedure and laterality before marking the site Procedure checklist: Completed Consent: Before the procedure and under the influence of no sedative(s), amnesic(s), or anxiolytics, the patient was informed of the treatment options, risks and possible complications. To fulfill our ethical and legal obligations, as recommended by the American Medical Association's Code of Ethics, I have informed the patient of my clinical impression; the nature and purpose of the treatment or procedure; the risks, benefits, and possible complications of the intervention; the alternatives, including doing nothing; the risk(s) and benefit(s) of the alternative treatment(s) or procedure(s); and the risk(s) and benefit(s) of doing nothing. The patient was provided information about the general risks and possible complications associated with the procedure. These may include, but are not limited to:  failure to achieve desired goals, infection, bleeding, organ or nerve damage, allergic reactions, paralysis, and death. In addition, the patient was informed of those risks and complications associated to Spine-related procedures, such as failure to decrease pain; infection (i.e.: Meningitis, epidural or intraspinal abscess);  bleeding (i.e.: epidural hematoma, subarachnoid hemorrhage, or any other type of intraspinal or peri-dural bleeding); organ or nerve damage (i.e.: Any type of peripheral nerve, nerve root, or spinal cord injury) with subsequent damage to sensory, motor, and/or autonomic systems, resulting in permanent pain, numbness, and/or weakness of one or several areas of the body; allergic reactions; (i.e.: anaphylactic reaction); and/or death. Furthermore, the patient was informed of those risks and complications associated with the medications. These include, but are not limited to: allergic reactions (i.e.: anaphylactic or anaphylactoid reaction(s)); adrenal axis suppression; blood sugar elevation that in diabetics may result in ketoacidosis or comma; water retention that in patients with history of congestive heart failure may result in shortness of breath, pulmonary edema, and decompensation with resultant heart failure; weight gain; swelling or edema; medication-induced neural toxicity; particulate matter embolism and blood vessel occlusion with resultant organ, and/or nervous system infarction; and/or aseptic necrosis of one or more joints. Finally, the patient was informed that Medicine is not an exact science; therefore, there is also the possibility of unforeseen or unpredictable risks and/or possible complications that may result in a catastrophic outcome. The patient indicated having understood very clearly. We have given the patient no guarantees and we have made no promises. Enough time was given to the patient to ask questions, all of which were answered to the patient's satisfaction. Mr.  Guerrero has indicated that he wanted to continue with the procedure. Attestation: I, the ordering provider, attest that I have discussed with the patient the benefits, risks, side-effects, alternatives, likelihood of achieving goals, and potential problems during recovery for the procedure that I have provided informed consent. Date  Time: 04/06/2023  1:56 PM   Pre-Procedure Preparation:  Monitoring: As per clinic protocol. Respiration, ETCO2, SpO2, BP, heart rate and rhythm monitor placed and checked for adequate function Safety Precautions: Patient was assessed for positional comfort and pressure points before starting the procedure. Time-out: I initiated and conducted the "Time-out" before starting the procedure, as per protocol. The patient was asked to participate by confirming the accuracy of the "Time Out" information. Verification of the correct person, site, and procedure were performed and confirmed by me, the nursing staff, and the patient. "Time-out" conducted as per Joint Commission's Universal Protocol (UP.01.01.01). Time: 1458 Start Time: 1458 hrs.  Description  Narrative of Procedure:          Rationale (medical necessity): procedure needed and proper for the diagnosis and/or treatment of the patient's medical symptoms and needs. Start Time: 1458 hrs. Safety Precautions: Aspiration looking for blood return was conducted prior to all injections. At no point did we inject any substances, as a needle was being advanced. No attempts were made at seeking any paresthesias. Safe injection practices and needle disposal techniques used. Medications properly checked for expiration dates. SDV (single dose vial) medications used. Description of procedure: Protocol guidelines were followed. The patient was assisted into a comfortable position. The target area was identified and the area prepped in the usual manner. Skin & deeper tissues infiltrated with local anesthetic. Appropriate amount of  time allowed to pass for local anesthetics to take effect. Using fluoroscopic guidance, the epidural needle was introduced through the skin, ipsilateral to the reported pain, and advanced to the target area. Posterior laminar os was contacted and the needle walked caudad, until the lamina was cleared. The ligamentum flavum was engaged and the epidural space identified using "loss-of-resistance technique" with 2-3 ml of PF-NaCl (0.9% NSS), in a 5cc dedicated LOR syringe. (See "Imaging  guidance" below for use of contrast details.) Once proper needle placement was secured, and negative aspiration confirmed, the solution was injected in intermittent fashion, asking for systemic symptoms every 0.5cc. The needles were then removed and the area cleansed, making sure to leave some of the prepping solution back to take advantage of its long term bactericidal properties.  3 cc solution made of 1 cc of preservative-free saline, 1 cc of 0.2% ropivacaine, 1 cc of Decadron 10 mg/cc.   Vitals:   04/06/23 1402 04/06/23 1458  BP: 116/77 121/87  Pulse: 77 84  Resp: 16 16  Temp: 97.8 F (36.6 C)   SpO2: 99% 100%  Weight: 167 lb (75.8 kg)   Height: 5\' 6"  (1.676 m)      End Time:   hrs.  Imaging Guidance (Spinal):          Type of Imaging Technique: Fluoroscopy Guidance (Spinal) Indication(s): Assistance in needle guidance and placement for procedures requiring needle placement in or near specific anatomical locations not easily accessible without such assistance. Exposure Time: Please see nurses notes. Contrast: Before injecting any contrast, we confirmed that the patient did not have an allergy to iodine, shellfish, or radiological contrast. Once satisfactory needle placement was completed at the desired level, radiological contrast was injected. Contrast injected under live fluoroscopy. No contrast complications. See chart for type and volume of contrast used. Fluoroscopic Guidance: I was personally present  during the use of fluoroscopy. "Tunnel Vision Technique" used to obtain the best possible view of the target area. Parallax error corrected before commencing the procedure. "Direction-depth-direction" technique used to introduce the needle under continuous pulsed fluoroscopy. Once target was reached, antero-posterior, oblique, and lateral fluoroscopic projection used confirm needle placement in all planes. Images permanently stored in EMR. Interpretation: I personally interpreted the imaging intraoperatively. Adequate needle placement confirmed in multiple planes. Appropriate spread of contrast into desired area was observed. No evidence of afferent or efferent intravascular uptake. No intrathecal or subarachnoid spread observed. Permanent images saved into the patient's record.  Post-operative Assessment:  Post-procedure Vital Signs:  Pulse/HCG Rate: 84  Temp: 97.8 F (36.6 C) Resp: 16 BP: 121/87 SpO2: 100 %  EBL: None  Complications: No immediate post-treatment complications observed by team, or reported by patient.  Note: The patient tolerated the entire procedure well. A repeat set of vitals were taken after the procedure and the patient was kept under observation following institutional policy, for this type of procedure. Post-procedural neurological assessment was performed, showing return to baseline, prior to discharge. The patient was provided with post-procedure discharge instructions, including a section on how to identify potential problems. Should any problems arise concerning this procedure, the patient was given instructions to immediately contact us, at any time, without hesitation. In any case, we plan to contact the patient by telephone for a follow-up status report regarding this interventional procedure.  Comments:  No additional relevant information.  Plan of Care (POC)  Orders:  Orders Placed This Encounter  Procedures   DG PAIN CLINIC C-ARM 1-60 MIN NO REPORT     Intraoperative interpretation by procedural physician at [facility].    Standing Status:   Standing    Number of Occurrences:   1    Order Specific Question:   Reason for exam:    Answer:   Assistance in needle guidance and placement for procedures requiring needle placement in or near specific anatomical locations not easily accessible without such assistance.     Medications ordered for procedure:  Meds ordered this encounter  Medications   iohexol (OMNIPAQUE) 180 MG/ML injection 10 mL    Must be Myelogram-compatible. If not available, you may substitute with a water-soluble, non-ionic, hypoallergenic, myelogram-compatible radiological contrast medium.   lidocaine (XYLOCAINE) 2 % (with pres) injection 400 mg   sodium chloride flush (NS) 0.9 % injection 1 mL   ropivacaine (PF) 2 mg/mL (0.2%) (NAROPIN) injection 1 mL   dexamethasone (DECADRON) injection 10 mg   Medications administered: We administered iohexol, lidocaine, sodium chloride flush, ropivacaine (PF) 2 mg/mL (0.2%), and dexamethasone.  See the medical record for exact dosing, route, and time of administration.  Follow-up plan:   Return for Keep sch. appt.       Right L5-S1 ESI, Right C7-T1 ESI 04/06/23      Recent Visits Date Type Provider Dept  03/08/23 Office Visit Edward Jolly, MD [facility]  02/16/23 Procedure visit Edward Jolly, MD [facility]  Showing recent visits within past 90 days and meeting all other requirements Today's Visits Date Type Provider Dept  04/06/23 Procedure visit Edward Jolly, MD [facility]  Showing today's visits and meeting all other requirements Future Appointments Date Type Provider Dept  04/25/23 Appointment Edward Jolly, MD [facility]  Showing future appointments within next 90 days and meeting all other requirements  Disposition: Discharge home  Discharge (Date  Time): 04/06/2023;   hrs.   Primary Care Physician:  Jerl Mina, MD Location: [facility] Note by: Edward Jolly, MD (TTS technology used. I apologize for any typographical errors that were not detected and corrected.) Date: 04/06/2023; Time: 3:09 PM  Disclaimer:  Medicine is not an Visual merchandiser. The only guarantee in medicine is that nothing is guaranteed. It is important to note that the decision to proceed with this intervention was based on the information collected from the patient. The Data and conclusions were drawn from the patient's questionnaire, the interview, and the physical examination. Because the information was provided in large part by the patient, it cannot be guaranteed that it has not been purposely or unconsciously manipulated. Every effort has been made to obtain as much relevant data as possible for this evaluation. It is important to note that the conclusions that lead to this procedure are derived in large part from the available data. Always take into account that the treatment will also be dependent on availability of resources and existing treatment guidelines, considered by other Pain Management Practitioners as being common knowledge and practice, at the time of the intervention. For Medico-Legal purposes, it is also important to point out that variation in procedural techniques and pharmacological choices are the acceptable norm. The indications, contraindications, technique, and results of the above procedure should only be interpreted and judged by a Board-Certified Interventional Pain Specialist with extensive familiarity and expertise in the same exact procedure and technique.

## 2023-04-06 NOTE — Patient Instructions (Signed)

## 2023-04-07 ENCOUNTER — Telehealth: Payer: Self-pay

## 2023-04-07 NOTE — Telephone Encounter (Signed)
Post procedure follow up.  Patient states he is doing well 

## 2023-04-25 ENCOUNTER — Ambulatory Visit
Payer: BLUE CROSS/BLUE SHIELD | Attending: Student in an Organized Health Care Education/Training Program | Admitting: Student in an Organized Health Care Education/Training Program

## 2023-04-25 ENCOUNTER — Ambulatory Visit
Admission: RE | Admit: 2023-04-25 | Discharge: 2023-04-25 | Disposition: A | Discharge status: Home / Self Care | Payer: BLUE CROSS/BLUE SHIELD | Source: Ambulatory Visit | Attending: Student in an Organized Health Care Education/Training Program | Admitting: Student in an Organized Health Care Education/Training Program

## 2023-04-25 ENCOUNTER — Encounter: Payer: Self-pay | Admitting: Student in an Organized Health Care Education/Training Program

## 2023-04-25 DIAGNOSIS — G8929 Other chronic pain: Secondary | ICD-10-CM | POA: Diagnosis present

## 2023-04-25 DIAGNOSIS — M5116 Intervertebral disc disorders with radiculopathy, lumbar region: Secondary | ICD-10-CM | POA: Diagnosis present

## 2023-04-25 DIAGNOSIS — M5416 Radiculopathy, lumbar region: Secondary | ICD-10-CM | POA: Insufficient documentation

## 2023-04-25 DIAGNOSIS — G894 Chronic pain syndrome: Secondary | ICD-10-CM

## 2023-04-25 DIAGNOSIS — M47816 Spondylosis without myelopathy or radiculopathy, lumbar region: Secondary | ICD-10-CM | POA: Insufficient documentation

## 2023-04-25 MED ORDER — MIDAZOLAM HCL 5 MG/5ML IJ SOLN
INTRAMUSCULAR | Status: AC
  Filled 2023-04-25: qty 5

## 2023-04-25 MED ORDER — LIDOCAINE HCL 2 % IJ SOLN
INTRAMUSCULAR | Status: AC
  Filled 2023-04-25: qty 20

## 2023-04-25 MED ORDER — LACTATED RINGERS IV SOLN: Freq: Once | INTRAVENOUS | Status: AC

## 2023-04-25 MED ORDER — CEPHALEXIN 500 MG PO CAPS: 500.0000 mg | ORAL_CAPSULE | Freq: Four times a day (QID) | ORAL | 0 refills | Status: AC

## 2023-04-25 MED ORDER — MIDAZOLAM HCL 5 MG/5ML IJ SOLN
0.5000 mg | Freq: Once | INTRAMUSCULAR | Status: AC
  Administered 2023-04-25: 1.5 mg via INTRAVENOUS

## 2023-04-25 MED ORDER — ROPIVACAINE HCL 2 MG/ML IJ SOLN
9.0000 mL | Freq: Once | INTRAMUSCULAR | Status: AC
  Administered 2023-04-25: 9 mL via PERINEURAL

## 2023-04-25 MED ORDER — LIDOCAINE HCL 2 % IJ SOLN
20.0000 mL | Freq: Once | INTRAMUSCULAR | Status: AC
  Administered 2023-04-25: 400 mg

## 2023-04-25 MED ORDER — ROPIVACAINE HCL 2 MG/ML IJ SOLN
INTRAMUSCULAR | Status: AC
  Filled 2023-04-25: qty 20

## 2023-04-25 MED ORDER — FENTANYL CITRATE (PF) 100 MCG/2ML IJ SOLN
25.0000 ug | INTRAMUSCULAR | Status: DC | PRN
  Administered 2023-04-25: 50 ug via INTRAVENOUS

## 2023-04-25 MED ORDER — SODIUM CHLORIDE (PF) 0.9 % IJ SOLN
INTRAMUSCULAR | Status: AC
  Filled 2023-04-25: qty 10

## 2023-04-25 MED ORDER — CEFAZOLIN SODIUM-DEXTROSE 2-4 GM/100ML-% IV SOLN
2.0000 g | INTRAVENOUS | Status: AC
  Administered 2023-04-25: 2 g via INTRAVENOUS
  Filled 2023-04-25: qty 100

## 2023-04-25 MED ORDER — DEXAMETHASONE SODIUM PHOSPHATE 10 MG/ML IJ SOLN
INTRAMUSCULAR | Status: AC
  Filled 2023-04-25: qty 2

## 2023-04-25 MED ORDER — CEFAZOLIN SODIUM 1 G IJ SOLR
INTRAMUSCULAR | Status: AC
  Filled 2023-04-25: qty 20

## 2023-04-25 MED ORDER — FENTANYL CITRATE (PF) 100 MCG/2ML IJ SOLN
INTRAMUSCULAR | Status: AC
  Filled 2023-04-25: qty 2

## 2023-04-25 NOTE — Progress Notes (Signed)
Safety precautions to be maintained throughout the outpatient stay will include: orient to surroundings, keep bed in low position, maintain call bell within reach at all times, provide assistance with transfer out of bed and ambulation.  

## 2023-04-25 NOTE — Progress Notes (Signed)
PROVIDER NOTE: Interpretation of information contained herein should be left to medically-trained personnel. Specific patient instructions are provided elsewhere under "Patient Instructions" section of medical record. This document was created in part using STT-dictation technology, any transcriptional errors that may result from this process are unintentional.  Patient: Anthony Guerrero Type: Established DOB: [birth year] MRN: [id number] PCP: Jerl Mina, MD  Service: Procedure DOS: 04/25/2023 Setting: Ambulatory Location: Ambulatory outpatient facility Delivery: Face-to-face Provider: Edward Jolly, MD Specialty: Interventional Pain Management Specialty designation: 09 Location: Outpatient facility Ref. Prov.: Edward Jolly, MD       Interventional Therapy   Primary Reason for Admission: Surgical management of chronic pain condition.   Procedure:              Type: MEDTRONIC Trial Spinal Cord Neurostimulator Implant (Percutaneous, interlaminar, posterior epidural placement) Laterality: Bilateral (-50)  Level: Lumbar  Imaging: Fluoroscopic guidance Anesthesia: Local anesthesia (1-2% Lidocaine) Sedation: Moderate Sedation  DOS: 04/25/2023  Performed by: Edward Jolly, MD  Purpose: Diagnostic. To determine if a permanent implant may be effective in controlling some or all of Anthony Guerrero chronic pain symptoms.   Rationale (medical necessity): procedure needed and proper for the diagnosis and/or treatment of Anthony Guerrero medical symptoms and needs. 1. Lumbar disc herniation with radiculopathy   2. Chronic radicular lumbar pain   3. Lumbar radiculopathy   4. Lumbar spondylosis   5. Chronic pain syndrome    NAS-11 Pain score:   Pre-procedure: 8 /10   Post-procedure: 8 /10     Target: Posterior epidural space over the dorsal columns of the spinal cord. Location: Posterior intraspinal canal Region: Thoracolumbar  Approach: Translaminar percutaneous  Type of procedure:  Surgical   Position / Prep / Materials:  Position: Prone  Prep solution: DuraPrep (Iodine Povacrylex [0.7% available iodine] and Isopropyl Alcohol, 74% w/w) Prep Area: Entire  Posterior  Thoracolumbar  Region  Materials:  Tray: Implant tray Needle(s):  Type: Epidural  Gauge (G):  14   Length: Regular (10cm)  Qty: 2  H&P (Pre-op Assessment):  Anthony Guerrero is a 62 y.o. (year old), male patient, seen today for interventional treatment. He  has a past surgical history that includes Fracture surgery.  Initial Vital Signs:  Pulse/EKG Rate: 66ECG Heart Rate: (!) 59 Temp: 98.2 F (36.8 C) Resp: 16 BP: 119/81 SpO2: 100 %  BMI: Estimated body mass index is 26.16 kg/m as calculated from the following:   Height as of this encounter: 5\' 7"  (1.702 m).   Weight as of this encounter: 167 lb (75.8 kg).  Risk Assessment: Allergies: Reviewed. He has No Known Allergies.  Allergy Precautions: None required Coagulopathies: Reviewed. None identified.  Blood-thinner therapy: None at this time Active Infection(s): Reviewed. None identified. Anthony Guerrero is afebrile  Site Confirmation: Anthony Guerrero was asked to confirm the procedure and laterality before marking the site, which he did. Procedure checklist: Completed Consent: Before the procedure and under the influence of no sedative(s), amnesic(s), or anxiolytics, the patient was informed of the treatment options, risks and possible complications. To fulfill our ethical and legal obligations, as recommended by the American Medical Association's Code of Ethics, I have informed the patient of my clinical impression; the nature and purpose of the treatment or procedure; the risks, benefits, and possible complications of the intervention; the alternatives, including doing nothing; the risk(s) and benefit(s) of the alternative treatment(s) or procedure(s); and the risk(s) and benefit(s) of doing nothing.  Anthony Guerrero was provided with information about  the general risks and  possible complications associated with most interventional procedures. These include, but are not limited to: failure to achieve desired goals, infection, bleeding, organ or nerve damage, allergic reactions, paralysis, and/or death.  In addition, he was informed of those risks and possible complications associated to this particular procedure, which include, but are not limited to: damage to the implant; failure to decrease pain; local, systemic, or serious CNS infections, intraspinal abscess with possible cord compression and paralysis, or life-threatening such as meningitis; intrathecal and/or epidural bleeding with formation of hematoma with possible spinal cord compression and permanent paralysis; organ damage; nerve injury or damage with subsequent sensory, motor, and/or autonomic system dysfunction, resulting in transient or permanent pain, numbness, and/or weakness of one or several areas of the body; allergic reactions, either minor or major life-threatening, such as anaphylactic or anaphylactoid reactions.  Furthermore, Anthony Guerrero was informed of those risks and complications associated with the medications. These include, but are not limited to: allergic reactions (i.e.: anaphylactic or anaphylactoid reactions); arrhythmia;  Hypotension/hypertension; cardiovascular collapse; respiratory depression and/or shortness of breath; swelling or edema; medication-induced neural toxicity; particulate matter embolism and blood vessel occlusion with resultant organ, and/or nervous system infarction and permanent paralysis.  Finally, he was informed that Medicine is not an exact science; therefore, there is also the possibility of unforeseen or unpredictable risks and/or possible complications that may result in a catastrophic outcome. The patient indicated having understood very clearly. We have given the patient no guarantees and we have made no promises. Enough time was given to the  patient to ask questions, all of which were answered to the patient's satisfaction. Anthony Guerrero has indicated that he wanted to continue with the procedure. Attestation: I, the ordering provider, attest that I have discussed with the patient the benefits, risks, side-effects, alternatives, likelihood of achieving goals, and potential problems during recovery for the procedure that I have provided informed consent. Date  Time: 04/25/2023  7:49 AM  Pre-Procedure Preparation:  Monitoring: As per clinic protocol. Respiration, ETCO2, SpO2, BP, heart rate and rhythm monitor placed and checked for adequate function Safety Precautions: Patient was assessed for positional comfort and pressure points before starting the procedure. Time-out: I initiated and conducted the "Time-out" before starting the procedure, as per protocol. The patient was asked to participate by confirming the accuracy of the "Time Out" information. Verification of the correct person, site, and procedure were performed and confirmed by me, the nursing staff, and the patient. "Time-out" conducted as per Joint Commission's Universal Protocol (UP.01.01.01). Time: 0835 Start Time: 0836 hrs.  Description/Narrative of Procedure:          Rationale (medical necessity): procedure needed and proper for the diagnosis and/or treatment of the patient's medical symptoms and needs. Procedural Technique Safety Precautions: Aspiration looking for blood return was conducted prior to all injections. At no point did we inject any substances, as a needle was being advanced. No attempts were made at seeking any paresthesias. Safe injection practices and needle disposal techniques used. Medications properly checked for expiration dates. SDV (single dose vial) medications used. Description of the Procedure: Protocol guidelines were followed. The patient was assisted into a comfortable position. The target area was identified and the area prepped in the usual  manner. Skin & deeper tissues infiltrated with local anesthetic. Appropriate amount of time allowed to pass for local anesthetics to take effect. The procedure needles were then advanced to the target area. Proper needle placement secured. Negative aspiration confirmed. Solution injected in intermittent fashion, asking for systemic  symptoms every 0.5cc of injectate. The needles were then removed and the area cleansed, making sure to leave some of the prepping solution back to take advantage of its long term bactericidal properties.  Technical description of procedure: Availability of a responsible, adult driver, and NPO status confirmed. Informed consent was obtained after having discussed risks and possible complications. An IV was started. The patient was then taken to the fluoroscopy suite, where the patient was placed in position for the procedure, over the fluoroscopy table. The patient was then monitored in the usual manner. Fluoroscopy was manipulated to obtain the best possible view of the target. Parallex error was corrected before commencing the procedure. Once a clear view of the target had been obtained, the skin and deeper tissues over the procedure site were infiltrated using lidocaine, loaded in a 10 cc luer-loc syringe with a 0.5 inch, 25-G needle. The introducer needle(s) was/were then inserted through the skin and deeper tissues. A paramidline approach was used to enter the posterior epidural space at a 30 angle, using "Loss-of-resistance Technique" with 3 ml of PF-NaCl (0.9% NSS). Correct needle placement was confirmed in the antero-posterior and lateral fluoroscopic views. The lead was gently introduced and manipulated under real-time fluoroscopy, constantly assessing for pain, discomfort, or paresthesias, until the tip rested at the desired level. Both sides were done in identical fashion. Electrode placement was tested until appropriate coverage was attained. Once the patient confirmed  that the stimulation was over the desired area, the lead(s) was/were secured in place and the introducer needles removed. This was done under real-time fluoroscopy while observing the electrode tip to avoid unintended migration. The area was covered with a non-occlusive dressing and the patient transported to recovery for further programming.  Vitals:   04/25/23 0905 04/25/23 0915 04/25/23 0925 04/25/23 0935  BP: (!) 133/96 (!) 139/94 125/84 123/86  Pulse: 63 62    Resp: 15 16 16 13   Temp:      TempSrc:      SpO2: 100% 100% 100% 100%  Weight:      Height:        Start Time: 0836 hrs. End Time: 0855 (procedure finished at 868m ,taping and suturing [phone]) hrs.  Neurostimulator Details:   Lead(s):  Brand: Medtronic         Epidural Access Level:  T12-L1 T12-L1  Lead implant:  Bilateral   No. of Electrodes/Lead:  8 8  Laterality:  Left Right  Top electrode location:  T7 (mid) T7 (mid)  Bottom electrode location:  T9 T9  Model No.: [id number]  Length: 60 cm Same  Lot No.: [id number]  MRI compatibility:  Yes Yes   Imaging Guidance (Spinal):          Type of Imaging Technique: Fluoroscopy Guidance (Spinal) Indication(s): Assistance in needle guidance and placement for procedures requiring needle placement in or near specific anatomical locations not easily accessible without such assistance. Exposure Time: Please see nurses notes. Contrast: None used. Fluoroscopic Guidance: I was personally present during the use of fluoroscopy. "Tunnel Vision Technique" used to obtain the best possible view of the target area. Parallax error corrected before commencing the procedure. "Direction-depth-direction" technique used to introduce the needle under continuous pulsed fluoroscopy. Once target was reached, antero-posterior, oblique, and lateral fluoroscopic projection used confirm needle placement in all planes. Images permanently stored in EMR. Interpretation: No contrast  injected. I personally interpreted the imaging intraoperatively. Adequate needle placement confirmed in multiple planes. Permanent images saved into the patient's record.  Antibiotic Prophylaxis:   Anti-infectives (From admission, onward)    Start     Dose/Rate Route Frequency Ordered Stop   04/25/23 0815  ceFAZolin (ANCEF) IVPB 2g/100 mL premix        2 g 200 mL/hr over 30 Minutes Intravenous 30 min pre-op 04/25/23 0815 04/25/23 0845   04/25/23 0000  cephALEXin (KEFLEX) 500 MG capsule        500 mg Oral 4 times daily 04/25/23 0814 05/02/23 2359      Indication(s): Implant Prophylaxis.  Post-operative Assessment:  Post-procedure Vital Signs:  Pulse/HCG Rate: 62(!) 58 Temp: 98.2 F (36.8 C) Resp: 13 BP: 123/86 SpO2: 100 %  Complications: No immediate post-treatment complications observed by team, or reported by patient.  Note: The patient tolerated the entire procedure well. A repeat set of vitals were taken after the procedure and the patient was kept under observation following institutional policy, for this type of procedure. Post-procedural neurological assessment was performed, showing return to baseline, prior to discharge. The patient was provided with post-procedure discharge instructions, including a section on how to identify potential problems. Should any problems arise concerning this procedure, the patient was given instructions to immediately contact us, at any time, without hesitation. In any case, we plan to contact the patient by telephone for a follow-up status report regarding this interventional procedure.  Comments:  No additional relevant information.     Plan of Care  Orders:  Orders Placed This Encounter  Procedures   DG PAIN CLINIC C-ARM 1-60 MIN NO REPORT    Intraoperative interpretation by procedural physician at [facility].    Standing Status:   Standing    Number of Occurrences:   1    Order Specific Question:   Reason for exam:     Answer:   Assistance in needle guidance and placement for procedures requiring needle placement in or near specific anatomical locations not easily accessible without such assistance.    Medications administered: We administered lidocaine, lactated ringers, midazolam, fentaNYL, ropivacaine (PF) 2 mg/mL (0.2%), and ceFAZolin.  See the medical record for exact dosing, route, and time of administration.  Follow-up plan:   Return in about 8 days (around 05/03/2023) for SCS lead pull.       Right L5-S1 ESI, Right C7-T1 ESI 04/06/23, SCS trial 04/25/23       Recent Visits Date Type Provider Dept  04/06/23 Procedure visit Edward Jolly, MD [facility]  03/08/23 Office Visit Edward Jolly, MD [facility]  02/16/23 Procedure visit Edward Jolly, MD [facility]  Showing recent visits within past 90 days and meeting all other requirements Today's Visits Date Type Provider Dept  04/25/23 Procedure visit Edward Jolly, MD [facility]  Showing today's visits and meeting all other requirements Future Appointments Date Type Provider Dept  05/03/23 Appointment Edward Jolly, MD [facility]  Showing future appointments within next 90 days and meeting all other requirements  Disposition: Discharge home  Discharge (Date  Time): 04/25/2023;   hrs.   Primary Care Physician: Jerl Mina, MD Location: [facility] Note by: Edward Jolly, MD (TTS technology used. I apologize for any typographical errors that were not detected and corrected.) Date: 04/25/2023; Time: 10:30 AM

## 2023-04-25 NOTE — Patient Instructions (Addendum)
Today we did the following -We have done a Spinal Cord Stimulator Trial with MEDTRONIC  -As long as the leads are in place, do not bathe or shower. You may sponge bathe.  -While the lead is in place, please limit the bending, lifting, or twisting because the lead can move.  -The things we want to see is if your pain improves (and by what percentage), if you can do more activity (don't overdo it), and if you can use less of your "as needed" medicine. Do not stop long acting medicines like methadone, oxycontin, MS Contin, etc without checking with [place].  -It is VERY important that you pick up the antibiotics we prescribed, Keflex, on your way home from the trial and take them as prescribed(4 times a day), starting today, for as long as the lead is in place.  -The Spina Cord Stimulator Representative will be in contact with you while the lead is in place to make sure the trial goes as well as possible.  -Please contact us with any questions or concerns at any time during the trial.   -If you start running a fever over 100 degrees, have severe back pain, or new pain running down the legs, or drainage coming from the lead site, contact us immediately and/or go to the emergency room.  -Please do not restart any sort of medication that can thin your blood such as Aspirin, ibuprofen, motrin, aleve, plavix, coumadin, etc. If you aren't sure, call and ask.  -We will have you return on Tues July 16th to have the leads removed. If this is successful, at that point we can go over the details about the permanent implant.

## 2023-04-26 ENCOUNTER — Telehealth: Payer: Self-pay

## 2023-04-26 NOTE — Telephone Encounter (Signed)
Post procedure follow up. Patient states he is doing ok.  

## 2023-04-28 IMAGING — XA DG INJECT/[PERSON_NAME] INC NEEDLE/CATH/PLC EPI/CERV/THOR W/IMG
2 series · 2 of 2 positions shown · non-contrast
Comparison: none

CLINICAL DATA: Cervical radiculopathy. Displacement of the cervical
disc at C5-6 and C6-7.

[Series 1: ortho adipose · 1 of 1 slices shown (1 of 2)]
[im 1/1]
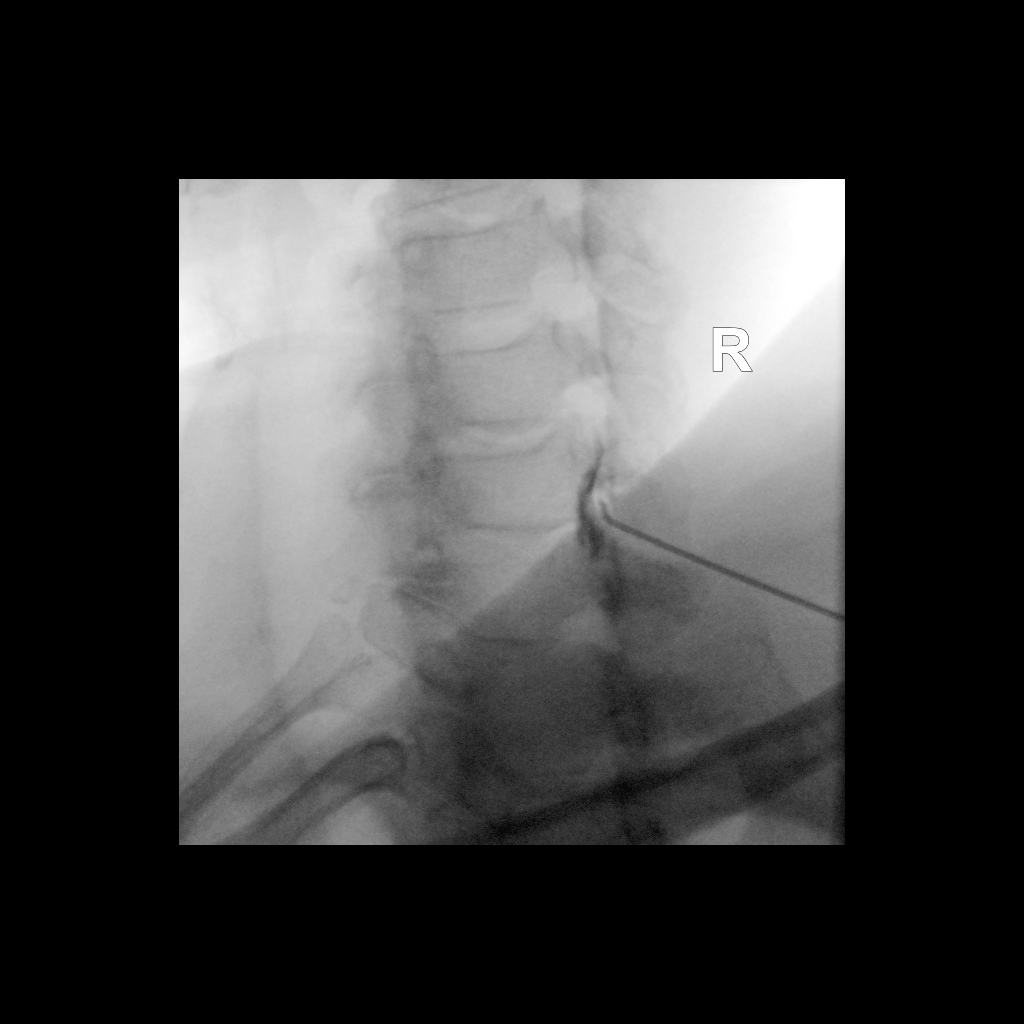

[Series 2: ortho adipose · 1 of 1 slices shown (2 of 2)]
[im 1/1]
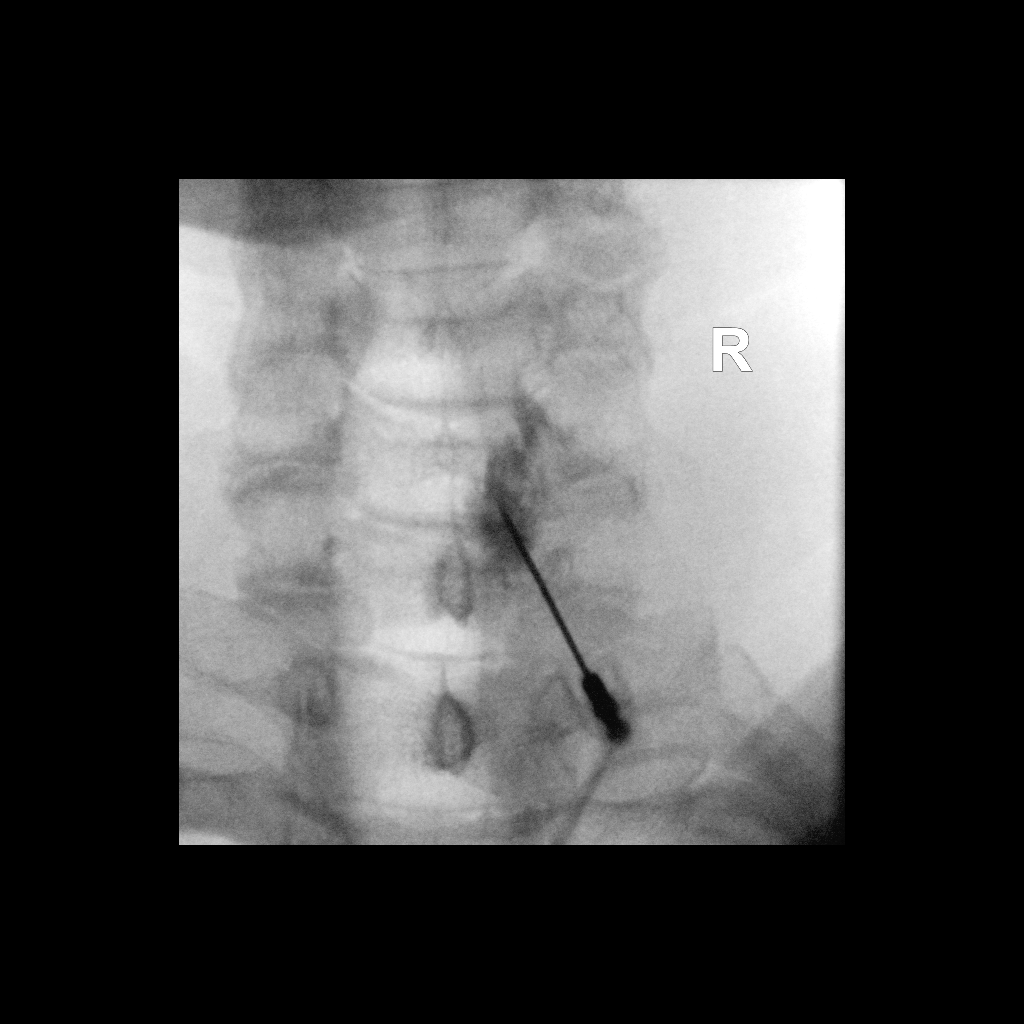

[2 of 2 positions shown; findings below may reference images not displayed]

FLUOROSCOPY:
Radiation Exposure Index (as provided by the fluoroscopic device):
Dose area product Radiation Exposure Index (as provided by the
fluoroscopic device): Dose area product 6.22 uGy*m2

PROCEDURE:
CERVICAL EPIDURAL INJECTION

An interlaminar approach was performed on the right at C6-7. A 20
gauge epidural needle was advanced using loss-of-resistance
technique.

DIAGNOSTIC EPIDURAL INJECTION

Injection of Isovue-M 300 shows a good epidural pattern with spread
above and below the level of needle placement, primarily on the
right. No vascular opacification is seen. THERAPEUTIC

EPIDURAL INJECTION

1.5 ml of Kenalog 40 mixed with 1 ml of 1% Lidocaine and 2 ml of
normal saline were then instilled. The procedure was well-tolerated,
and the patient was discharged thirty minutes following the
injection in good condition.
IMPRESSION: Technically successful second epidural injection on the right at
C6-7.

## 2023-05-03 ENCOUNTER — Encounter: Payer: Self-pay | Admitting: Student in an Organized Health Care Education/Training Program

## 2023-05-03 ENCOUNTER — Ambulatory Visit: Admission: RE | Admit: 2023-05-03 | Payer: BLUE CROSS/BLUE SHIELD | Source: Ambulatory Visit

## 2023-05-03 ENCOUNTER — Ambulatory Visit
Payer: BLUE CROSS/BLUE SHIELD | Attending: Student in an Organized Health Care Education/Training Program | Admitting: Student in an Organized Health Care Education/Training Program

## 2023-05-03 VITALS — BP 125/74 | HR 62 | Temp 97.3°F | Resp 16 | Ht 66.0 in | Wt 167.0 lb

## 2023-05-03 DIAGNOSIS — M5116 Intervertebral disc disorders with radiculopathy, lumbar region: Secondary | ICD-10-CM

## 2023-05-03 DIAGNOSIS — G894 Chronic pain syndrome: Secondary | ICD-10-CM

## 2023-05-03 DIAGNOSIS — G8929 Other chronic pain: Secondary | ICD-10-CM

## 2023-05-03 DIAGNOSIS — M5416 Radiculopathy, lumbar region: Secondary | ICD-10-CM

## 2023-05-03 NOTE — Progress Notes (Signed)
Patient: Anthony Guerrero  Service Category: Procedure  Provider: Edward Jolly, MD  DOB: 10-22-1960  DOS: 05/03/2023  Referring Provider: Jerl Mina, MD  MRN: [id number]  Setting: Ambulatory outpatient  PCP: Jerl Mina, MD  Type: Established Patient  Specialty: Interventional Pain Management    Location: Office  Delivery: Face-to-face  SCS TRIAL POST-OP EVALUATION   Primary Reason(s) for Visit: Encounter for removal of temporary spinal cord stimulator lead(s) and evaluation of trial implant. CC: Back Pain (Lumbar worse on the right ) and Neck Pain (Bilateral )  HPI  Anthony Guerrero is a 62 y.o. year old, male patient, who comes today for a post-procedure evaluation. He has Lumbar disc herniation with radiculopathy; Chronic radicular lumbar pain; Cervical radicular pain; Lumbar spondylosis; and Chronic pain syndrome on their problem list. His primarily concern today is the Back Pain (Lumbar worse on the right ) and Neck Pain (Bilateral )  Anthony Guerrero comes in today, after a SCS (Spinal Cord Stimulator) Trial Implant on 04/26/2023, to have his percutaneous, temporary neurostimulator lead(s) removed and to evaluate the trial experience to determine if a permanent implant may be effective in controlling some or all of his chronic pain symptoms.  Further details on both, my assessment(s), as well as the proposed treatment plan, please see below.  Patient notes 75% pain relief for his radicular pain during his SCS trial.  He states that he was able to perform activities with less pain.  He states that ADLs were more comfortable to complete.  He would like to move forward with SCS implant.  Post-operative Assessment  Intra-procedural problems/complications: None observed.         Reported side-effects: None.        Post-surgical adverse reactions or complications: None reported           Meds   Current Outpatient Medications:    celecoxib (CELEBREX) 100 MG capsule, Take 100 mg by mouth 2 (two)  times daily., Disp: , Rfl:    gabapentin (NEURONTIN) 100 MG capsule, Take 100 mg by mouth 3 (three) times daily. 100mg  in the morning and noon, 200mg  at night, Disp: , Rfl:    latanoprost (XALATAN) 0.005 % ophthalmic solution, Place 1 drop into both eyes at bedtime., Disp: , Rfl:    lidocaine (LIDODERM) 5 %, Place 1 patch onto the skin daily. Remove & Discard patch within 12 hours or as directed by MD, Disp: , Rfl:    methocarbamol (ROBAXIN) 750 MG tablet, Take 1 tablet (750 mg total) by mouth every 12 (twelve) hours as needed for muscle spasms., Disp: 60 tablet, Rfl: 1  ROS  Constitutional: Denies any fever or chills Gastrointestinal: No reported hemesis, hematochezia, vomiting, or acute GI distress Musculoskeletal: Denies any acute onset joint swelling, redness, loss of ROM, or weakness Neurological: No reported episodes of acute onset apraxia, aphasia, dysarthria, agnosia, amnesia, paralysis, loss of coordination, or loss of consciousness  Allergies  Anthony Guerrero has No Known Allergies.  PFSH  Drug: Anthony Guerrero  reports no history of drug use. Alcohol:  reports no history of alcohol use. Tobacco:  reports that he has quit smoking. He has never used smokeless tobacco. Medical:  has no past medical history on file. Surgical: Anthony Guerrero  has a past surgical history that includes Fracture surgery. Family: family history is not on file.  Postop Exam  General appearance: Afebrile. Well nourished, well developed, and well hydrated. In no apparent acute distress. Vitals:   05/03/23 0821  BP: 125/74  Pulse: 62  Resp: 16  Temp: (!) 97.3 F (36.3 C)  TempSrc: Temporal  SpO2: 100%  Weight: 167 lb (75.8 kg)  Height: 5\' 6"  (1.676 m)   BMI Assessment: Estimated body mass index is 26.95 kg/m as calculated from the following:   Height as of this encounter: 5\' 6"  (1.676 m).   Weight as of this encounter: 167 lb (75.8 kg).  Surgical site: Wound is healing well. No redness, tenderness,  discharge, abnormal odors, or any other evidence of infection or complications. Spinal cord stimulator trial leads removed under live fluoroscopy, tips intact  5 out of 5 strength bilateral lower extremity: Plantar flexion, dorsiflexion, knee flexion, knee extension.  Assessment  Primary Diagnosis & Pertinent Problem List: The primary encounter diagnosis was Lumbar disc herniation with radiculopathy. Diagnoses of Chronic radicular lumbar pain, Lumbar radiculopathy, and Chronic pain syndrome were also pertinent to this visit.  Diagnosis Status  1. Lumbar disc herniation with radiculopathy   2. Chronic radicular lumbar pain   3. Lumbar radiculopathy   4. Chronic pain syndrome    Responding Responding Responding   Plan of Care  We discussed spinal cord stimulation implant in detail.  I discussed options with him eluding paddle versus percutaneous placement as the patient does not have any prior lumbar spine surgery.  He elected to go with percutaneous implant.  I informed him that my colleague, Dr. Laban Emperor as well as myself will be doing the procedure together.  He was okay with that.  He was provided with antimicrobial soap solution to cleanse his back with prior to the procedure.   Orders:  Orders Placed This Encounter  Procedures   Spinal Cord Stimulator Placement    Standing Status:   Future    Standing Expiration Date:   08/03/2023    Scheduling Instructions:     [employer] SCS trial (with Dr Laban Emperor)     ECT   DG PAIN CLINIC C-ARM 1-60 MIN NO REPORT    Intraoperative interpretation by procedural physician at [facility].    Standing Status:   Standing    Number of Occurrences:   1    Order Specific Question:   Reason for exam:    Answer:   Assistance in needle guidance and placement for procedures requiring needle placement in or near specific anatomical locations not easily accessible without such assistance.     Medications administered: Emelia Salisbury had  no medications administered during this visit.  See the medical record for exact dosing, route, and time of administration.  Follow-up plan:   Return in about 4 weeks (around 05/31/2023) for [employer] SCS IMPLANT (with Dr Laban Emperor), ECT.       Right L5-S1 ESI, Right C7-T1 ESI 04/06/23, SCS trial 04/25/23        Recent Visits Date Type Provider Dept  04/25/23 Procedure visit Edward Jolly, MD [facility]  04/06/23 Procedure visit Edward Jolly, MD [facility]  03/08/23 Office Visit Edward Jolly, MD [facility]  02/16/23 Procedure visit Edward Jolly, MD [facility]  Showing recent visits within past 90 days and meeting all other requirements Today's Visits Date Type Provider Dept  05/03/23 Procedure visit Edward Jolly, MD [facility]  Showing today's visits and meeting all other requirements Future Appointments No visits were found meeting these conditions. Showing future appointments within next 90 days and meeting all other requirements  Disposition: Discharge home  Discharge (Date  Time): 05/03/2023; 0944 hrs.   Primary Care  Physician: Jerl Mina, MD Location: [facility] Note by: Edward Jolly, MD (TTS technology used. I apologize for any typographical errors that were not detected and corrected.) Date: 05/03/2023; Time: 10:48 AM

## 2023-05-03 NOTE — Progress Notes (Signed)
Safety precautions to be maintained throughout the outpatient stay will include: orient to surroundings, keep bed in low position, maintain call bell within reach at all times, provide assistance with transfer out of bed and ambulation.  

## 2023-05-17 ENCOUNTER — Telehealth: Payer: Self-pay

## 2023-05-17 NOTE — Telephone Encounter (Signed)
The patient picked up his paperwork but he called back saying that he was told we would write him out of work for a whole week after his scs trial and that note was not in there. He wants you to write the work note and call him when it is ready to be picked up.

## 2023-05-17 NOTE — Telephone Encounter (Signed)
Patient notified that work note has been written

## 2023-06-07 ENCOUNTER — Encounter: Payer: Self-pay | Admitting: Student in an Organized Health Care Education/Training Program

## 2023-06-07 ENCOUNTER — Ambulatory Visit
Payer: BLUE CROSS/BLUE SHIELD | Attending: Student in an Organized Health Care Education/Training Program | Admitting: Student in an Organized Health Care Education/Training Program

## 2023-06-07 VITALS — BP 140/75 | HR 70 | Temp 97.4°F | Resp 16 | Ht 66.0 in | Wt 167.0 lb

## 2023-06-07 DIAGNOSIS — M5116 Intervertebral disc disorders with radiculopathy, lumbar region: Secondary | ICD-10-CM

## 2023-06-07 DIAGNOSIS — M5412 Radiculopathy, cervical region: Secondary | ICD-10-CM

## 2023-06-07 DIAGNOSIS — G894 Chronic pain syndrome: Secondary | ICD-10-CM

## 2023-06-07 DIAGNOSIS — M5416 Radiculopathy, lumbar region: Secondary | ICD-10-CM

## 2023-06-07 DIAGNOSIS — G8929 Other chronic pain: Secondary | ICD-10-CM | POA: Diagnosis present
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
## 2023-06-07 NOTE — Patient Instructions (Signed)

## 2023-06-07 NOTE — Progress Notes (Signed)
Safety precautions to be maintained throughout the outpatient stay will include: orient to surroundings, keep bed in low position, maintain call bell within reach at all times, provide assistance with transfer out of bed and ambulation.  

## 2023-06-13 ENCOUNTER — Ambulatory Visit: Payer: BLUE CROSS/BLUE SHIELD | Admitting: Student in an Organized Health Care Education/Training Program

## 2023-06-21 ENCOUNTER — Encounter: Payer: Self-pay | Admitting: Student in an Organized Health Care Education/Training Program

## 2023-06-21 ENCOUNTER — Ambulatory Visit
Payer: BLUE CROSS/BLUE SHIELD | Attending: Student in an Organized Health Care Education/Training Program | Admitting: Student in an Organized Health Care Education/Training Program

## 2023-06-21 VITALS — BP 121/87 | HR 63 | Temp 98.0°F | Resp 16 | Ht 66.0 in | Wt 172.0 lb

## 2023-06-21 DIAGNOSIS — M5416 Radiculopathy, lumbar region: Secondary | ICD-10-CM | POA: Insufficient documentation

## 2023-06-21 DIAGNOSIS — G894 Chronic pain syndrome: Secondary | ICD-10-CM | POA: Diagnosis present

## 2023-06-21 DIAGNOSIS — M5116 Intervertebral disc disorders with radiculopathy, lumbar region: Secondary | ICD-10-CM | POA: Diagnosis present

## 2023-06-21 NOTE — Patient Instructions (Signed)
Procedure instructions  Do not eat or drink fluids (other than water) for 6 hours before your procedure  No water for 2 hours before your procedure  Take your blood pressure medicine with a sip of water  Arrive 30 minutes before your appointment  Carefully read the "Preparing for your procedure" detailed instructions  If you have questions call us at [phone]  _____________________________________________________________________    ______________________________________________________________________  Preparing for your procedure  Appointments: If you think you may not be able to keep your appointment, call 24-48 hours in advance to cancel. We need time to make it available to others.  During your procedure appointment there will be: No Prescription Refills. No disability issues to discussed. No medication changes or discussions.  Instructions: Food intake: Avoid eating anything solid for at least 8 hours prior to your procedure. Clear liquid intake: You may take clear liquids such as water up to 2 hours prior to your procedure. (No carbonated drinks. No soda.) Transportation: Unless otherwise stated by your physician, bring a driver. Morning Medicines: Except for blood thinners, take all of your other morning medications with a sip of water. Make sure to take your heart and blood pressure medicines. If your blood pressure's lower number is above 100, the case will be rescheduled. Blood thinners: Make sure to stop your blood thinners as instructed.  If you take a blood thinner, but were not instructed to stop it, call our office [phone] and ask to talk to a nurse. Not stopping a blood thinner prior to certain procedures could lead to serious complications. Diabetics on insulin: Notify the staff so that you can be scheduled 1st case in the morning. If your diabetes requires high dose insulin, take only  of your normal insulin dose the morning of the procedure and  notify the staff that you have done so. Preventing infections: Shower with an antibacterial soap the morning of your procedure.  Build-up your immune system: Take 1000 mg of Vitamin C with every meal (3 times a day) the day prior to your procedure. Antibiotics: Inform the nursing staff if you are taking any antibiotics or if you have any conditions that may require antibiotics prior to procedures. (Example: recent joint implants)   Pregnancy: If you are pregnant make sure to notify the nursing staff. Not doing so may result in injury to the fetus, including death.  Sickness: If you have a cold, fever, or any active infections, call and cancel or reschedule your procedure. Receiving steroids while having an infection may result in complications. Arrival: You must be in the facility at least 30 minutes prior to your scheduled procedure. Tardiness: Your scheduled time is also the cutoff time. If you do not arrive at least 15 minutes prior to your procedure, you will be rescheduled.  Children: Do not bring any children with you. Make arrangements to keep them home. Dress appropriately: There is always a possibility that your clothing may get soiled. Avoid long dresses. Valuables: Do not bring any jewelry or valuables.  Reasons to call and reschedule or cancel your procedure: (Following these recommendations will minimize the risk of a serious complication.) Surgeries: Avoid having procedures within 2 weeks of any surgery. (Avoid for 2 weeks before or after any surgery). Flu Shots: Avoid having procedures within 2 weeks of a flu shots or . (Avoid for 2 weeks before or after immunizations). Barium: Avoid having a procedure within 7-10 days after having had a radiological study involving the use of radiological contrast. (  Myelograms, Barium swallow or enema study). Heart attacks: Avoid any elective procedures or surgeries for the initial 6 months after a "Myocardial Infarction" (Heart Attack). Blood  thinners: It is imperative that you stop these medications before procedures. Let us know if you if you take any blood thinner.  Infection: Avoid procedures during or within two weeks of an infection (including chest colds or gastrointestinal problems). Symptoms associated with infections include: Localized redness, fever, chills, night sweats or profuse sweating, burning sensation when voiding, cough, congestion, stuffiness, runny nose, sore throat, diarrhea, nausea, vomiting, cold or Flu symptoms, recent or current infections. It is specially important if the infection is over the area that we intend to treat. Heart and lung problems: Symptoms that may suggest an active cardiopulmonary problem include: cough, chest pain, breathing difficulties or shortness of breath, dizziness, ankle swelling, uncontrolled high or unusually low blood pressure, and/or palpitations. If you are experiencing any of these symptoms, cancel your procedure and contact your primary care physician for an evaluation.  Remember:  Regular Business hours are:  Monday to Thursday 8:00 AM to 4:00 PM  Provider's Schedule: Francisco Naveira, MD:  Procedure days: Tuesday and Thursday 7:30 AM to 4:00 PM  Bilal Lateef, MD:  Procedure days: Monday and Wednesday 7:30 AM to 4:00 PM 

## 2023-06-21 NOTE — Progress Notes (Signed)
PROVIDER NOTE: Information contained herein reflects review and annotations entered in association with encounter. Interpretation of such information and data should be left to medically-trained personnel. Information provided to patient can be located elsewhere in the medical record under "Patient Instructions". Document created using STT-dictation technology, any transcriptional errors that may result from process are unintentional.    Patient: Anthony Guerrero  Service Category: E/M  Provider: Edward Jolly, MD  DOB: [birth year]  DOS: 06/21/2023  Referring Provider: Jerl Mina, MD  MRN: [id number]  Specialty: Interventional Pain Management  PCP: Jerl Mina, MD  Type: Established Patient  Setting: Ambulatory outpatient    Location: Office  Delivery: Face-to-face     HPI  Mr. MORDECAI OO, a 62 y.o. year old male, is here today because of his Lumbar radiculopathy [M54.16]. Mr. Caulder primary complain today is Neck Pain and Back Pain   Pain Assessment: Severity of Chronic pain is reported as a 7 /10. Location: Back Lower/radiates down right leg to toes. Onset: More than a month ago. Quality: Aching, Burning, Nagging, Dull, Numbness, Shooting. Timing: Constant. Modifying factor(s): meds, scs. Vitals:  height is 5\' 6"  (1.676 m) and weight is 172 lb (78 kg). His temperature is 98 F (36.7 C). His blood pressure is 121/87 and his pulse is 63. His respiration is 16 and oxygen saturation is 100%.  BMI: Estimated body mass index is 27.76 kg/m as calculated from the following:   Height as of this encounter: 5\' 6"  (1.676 m).   Weight as of this encounter: 172 lb (78 kg). Last encounter: 06/07/2023. Last procedure: 05/03/2023.  Reason for encounter:  PRE-OP Visit prior to SCS implant .   Patient presents today for his preop visit prior to his scheduled SCS implant on September 17.  He is looking forward to his implant.  No questions or concerns.  He has his antimicrobial soap solution to  cleanse his back with the night before his procedure.  Of note, he has an upcoming appointment next Monday with preop testing.  He also has a cervical epidural steroid injection scheduled with me next Monday for his cervical radicular pain.  ROS  Constitutional: Denies any fever or chills Gastrointestinal: No reported hemesis, hematochezia, vomiting, or acute GI distress Musculoskeletal:  Low back pain with radiation into right leg Neurological: No reported episodes of acute onset apraxia, aphasia, dysarthria, agnosia, amnesia, paralysis, loss of coordination, or loss of consciousness  Medication Review  celecoxib, gabapentin, latanoprost, lidocaine, and methocarbamol  History Review  Allergy: Mr. Smyser has No Known Allergies. Drug: Mr. Ramakrishnan  reports no history of drug use. Alcohol:  reports no history of alcohol use. Tobacco:  reports that he has quit smoking. He has never used smokeless tobacco. Social: Mr. Mulvaney  reports that he has quit smoking. He has never used smokeless tobacco. He reports that he does not drink alcohol and does not use drugs. Medical:  has no past medical history on file. Surgical: Mr. Deboy  has a past surgical history that includes Fracture surgery. Family: family history is not on file.  Laboratory Chemistry Profile   Renal No results found for: "BUN", "CREATININE", "LABCREA", "BCR", "GFR", "GFRAA", "GFRNONAA", "LABVMA", "EPIRU", "EPINEPH24HUR", "NOREPRU", "NOREPI24HUR", "DOPARU", "DOPAM24HRUR"  Hepatic No results found for: "AST", "ALT", "ALBUMIN", "ALKPHOS", "HCVAB", "AMYLASE", "LIPASE", "AMMONIA"  Electrolytes No results found for: "NA", "K", "CL", "CALCIUM", "MG", "PHOS"  Bone No results found for: "VD25OH", "VD125OH2TOT", "JX9147WG9", "FA2130QM5", "25OHVITD1", "25OHVITD2", "25OHVITD3", "TESTOFREE", "TESTOSTERONE"  Inflammation (CRP: Acute Phase) (ESR: Chronic Phase)  No results found for: "CRP", "ESRSEDRATE", "LATICACIDVEN"       Note:  Above Lab results reviewed.   Physical Exam  General appearance: Well nourished, well developed, and well hydrated. In no apparent acute distress Mental status: Alert, oriented x 3 (person, place, & time)       Cardiac: RRR, no chest pain Respiratory: No evidence of acute respiratory distress MP: II Eyes: PERLA Vitals: BP 121/87   Pulse 63   Temp 98 F (36.7 C)   Resp 16   Ht 5\' 6"  (1.676 m)   Wt 172 lb (78 kg)   SpO2 100%   BMI 27.76 kg/m  BMI: Estimated body mass index is 27.76 kg/m as calculated from the following:   Height as of this encounter: 5\' 6"  (1.676 m).   Weight as of this encounter: 172 lb (78 kg). Ideal: Ideal body weight: 63.8 kg (140 lb 10.5 oz) Adjusted ideal body weight: 69.5 kg (153 lb 3.1 oz)  Assessment   Diagnosis  1. Lumbar radiculopathy   2. Lumbar disc herniation with radiculopathy   3. Chronic pain syndrome      Updated Problems: No problems updated.  Plan of Care  Follow-up as scheduled with preop testing, cervical epidural steroid injection next Monday followed by spinal cord stimulator implant with my colleague, Dr. Laban Emperor and myself on September 17.  Follow-up plan:   Return for Keep sch. appt.      Right L5-S1 ESI, Right C7-T1 ESI 04/06/23, SCS trial 04/25/23          Recent Visits Date Type Provider Dept  06/07/23 Office Visit Edward Jolly, MD [facility]  05/03/23 Procedure visit Edward Jolly, MD [facility]  04/25/23 Procedure visit Edward Jolly, MD [facility]  04/06/23 Procedure visit Edward Jolly, MD [facility]  Showing recent visits within past 90 days and meeting all other requirements Today's Visits Date Type Provider Dept  06/21/23 Office Visit Edward Jolly, MD [facility]  Showing today's visits and meeting all other requirements Future Appointments Date Type Provider Dept  06/27/23 Appointment Edward Jolly, MD [facility]  07/14/23 Appointment  Edward Jolly, MD [facility]  Showing future appointments within next 90 days and meeting all other requirements  I discussed the assessment and treatment plan with the patient. The patient was provided an opportunity to ask questions and all were answered. The patient agreed with the plan and demonstrated an understanding of the instructions.  Patient advised to call back or seek an in-person evaluation if the symptoms or condition worsens.  Duration of encounter: .  Total time on encounter, as per AMA guidelines included both the face-to-face and non-face-to-face time personally spent by the physician and/or other qualified health care professional(s) on the day of the encounter (includes time in activities that require the physician or other qualified health care professional and does not include time in activities normally performed by clinical staff). Physician's time may include the following activities when performed: Preparing to see the patient (e.g., pre-charting review of records, searching for previously ordered imaging, lab work, and nerve conduction tests) Review of prior analgesic pharmacotherapies. Reviewing PMP Interpreting ordered tests (e.g., lab work, imaging, nerve conduction tests) Performing post-procedure evaluations, including interpretation of diagnostic procedures Obtaining and/or reviewing separately obtained history Performing a medically appropriate examination and/or evaluation Counseling and educating the patient/family/caregiver Ordering medications, tests, or procedures Referring and communicating with other health care professionals (when not separately reported) Documenting clinical information  in the electronic or other health record Independently interpreting results (not separately reported) and communicating results to the patient/ family/caregiver Care coordination (not separately reported)  Note by: Edward Jolly, MD Date: 06/21/2023;  Time: 3:12 PM

## 2023-06-21 NOTE — Progress Notes (Signed)
Safety precautions to be maintained throughout the outpatient stay will include: orient to surroundings, keep bed in low position, maintain call bell within reach at all times, provide assistance with transfer out of bed and ambulation.  

## 2023-06-27 ENCOUNTER — Encounter
Admission: RE | Admit: 2023-06-27 | Discharge: 2023-06-27 | Disposition: A | Discharge status: Home / Self Care | Payer: BLUE CROSS/BLUE SHIELD | Source: Ambulatory Visit | Attending: Pain Medicine | Admitting: Pain Medicine

## 2023-06-27 ENCOUNTER — Ambulatory Visit
Payer: BLUE CROSS/BLUE SHIELD | Attending: Student in an Organized Health Care Education/Training Program | Admitting: Student in an Organized Health Care Education/Training Program

## 2023-06-27 ENCOUNTER — Ambulatory Visit
Admission: RE | Admit: 2023-06-27 | Discharge: 2023-06-27 | Disposition: A | Discharge status: Home / Self Care | Payer: BLUE CROSS/BLUE SHIELD | Source: Ambulatory Visit | Attending: Student in an Organized Health Care Education/Training Program | Admitting: Student in an Organized Health Care Education/Training Program

## 2023-06-27 ENCOUNTER — Other Ambulatory Visit: Payer: Self-pay

## 2023-06-27 ENCOUNTER — Encounter: Payer: Self-pay | Admitting: Student in an Organized Health Care Education/Training Program

## 2023-06-27 VITALS — BP 129/77 | HR 61 | Temp 98.4°F | Resp 15 | Ht 66.0 in | Wt 172.0 lb

## 2023-06-27 DIAGNOSIS — M5412 Radiculopathy, cervical region: Secondary | ICD-10-CM | POA: Diagnosis present

## 2023-06-27 DIAGNOSIS — G894 Chronic pain syndrome: Secondary | ICD-10-CM | POA: Diagnosis present

## 2023-06-27 HISTORY — DX: Sciatica, unspecified side: M54.30

## 2023-06-27 HISTORY — DX: Other intervertebral disc degeneration, lumbar region: M51.36

## 2023-06-27 HISTORY — DX: Other intervertebral disc degeneration, lumbar region without mention of lumbar back pain or lower extremity pain: M51.369

## 2023-06-27 MED ORDER — SODIUM CHLORIDE (PF) 0.9 % IJ SOLN
INTRAMUSCULAR | Status: AC
  Filled 2023-06-27: qty 10

## 2023-06-27 MED ORDER — IOHEXOL 180 MG/ML  SOLN
10.0000 mL | Freq: Once | INTRAMUSCULAR | Status: AC
  Administered 2023-06-27: 10 mL via EPIDURAL
  Filled 2023-06-27: qty 20

## 2023-06-27 MED ORDER — ROPIVACAINE HCL 2 MG/ML IJ SOLN
1.0000 mL | Freq: Once | INTRAMUSCULAR | Status: AC
  Administered 2023-06-27: 1 mL via EPIDURAL
  Filled 2023-06-27: qty 20

## 2023-06-27 MED ORDER — SODIUM CHLORIDE 0.9% FLUSH
1.0000 mL | Freq: Once | INTRAVENOUS | Status: AC
  Administered 2023-06-27: 1 mL

## 2023-06-27 MED ORDER — DEXAMETHASONE SODIUM PHOSPHATE 10 MG/ML IJ SOLN
10.0000 mg | Freq: Once | INTRAMUSCULAR | Status: AC
  Administered 2023-06-27: 10 mg
  Filled 2023-06-27: qty 1

## 2023-06-27 MED ORDER — LIDOCAINE HCL 2 % IJ SOLN
20.0000 mL | Freq: Once | INTRAMUSCULAR | Status: AC
  Administered 2023-06-27: 200 mg
  Filled 2023-06-27: qty 20

## 2023-06-27 NOTE — Patient Instructions (Signed)

## 2023-06-27 NOTE — Patient Instructions (Addendum)
Your procedure is scheduled on: Tuesday 07/05/23 To find out your arrival time, please call [phone] between 1PM - 3PM on:  Monday 07/04/23  Report to the Registration Desk on the 1st floor of the [facility]. FREE Valet parking is available.  If your arrival time is 6:00 am, do not arrive before that time as the [facility] entrance doors do not open until 6:00 am.  REMEMBER: Instructions that are not followed completely may result in serious medical risk, up to and including death; or upon the discretion of your surgeon and anesthesiologist your surgery may need to be rescheduled.  Do not eat food after midnight the night before surgery.  No gum chewing or hard candies.  You may however, drink CLEAR liquids up to 2 hours before you are scheduled to arrive for your surgery. Do not drink anything within 2 hours of your scheduled arrival time.  Clear liquids include: - water  - apple juice without pulp - gatorade (not RED colors) - black coffee or tea (Do NOT add milk or creamers to the coffee or tea) Do NOT drink anything that is not on this list.  Type 1 and Type 2 diabetics should only drink water.  One week prior to surgery: Stop Anti-inflammatories (NSAIDS) such as Advil, Aleve, Ibuprofen, Motrin, Naproxen, Naprosyn and Aspirin based products such as Excedrin, Goody's Powder, BC Powder. You may however, continue to take Tylenol if needed for pain up until the day of surgery.  Stop ANY OVER THE COUNTER supplements until after surgery.  Continue taking all prescribed medications until Monday night 07/04/23    TAKE ONLY THESE MEDICATIONS THE MORNING OF SURGERY WITH A SIP OF WATER:  gabapentin (NEURONTIN) 100 MG capsule   No Alcohol for 24 hours before or after surgery.  No Smoking including e-cigarettes for 24 hours before surgery.  No chewable tobacco products for at least 6 hours before surgery.  No nicotine patches on the day of surgery.  Do not use any  "recreational" drugs for at least a week (preferably 2 weeks) before your surgery.  Please be advised that the combination of cocaine and anesthesia may have negative outcomes, up to and including death. If you test positive for cocaine, your surgery will be cancelled.  On the morning of surgery brush your teeth with toothpaste and water, you may rinse your mouth with mouthwash if you wish. Do not swallow any toothpaste or mouthwash.  Use CHG Soap or wipes as directed on instruction sheet.  Do not wear lotions, powders, or perfumes.   Do not shave body hair from the neck down 48 hours before surgery.  Wear comfortable clothing (specific to your surgery type) to the hospital.  Do not wear jewelry, make-up, hairpins, clips or nail polish.  Contact lenses, hearing aids and dentures may not be worn into surgery.  Do not bring valuables to the hospital. [facility] is not responsible for any missing/lost belongings or valuables.   Notify your doctor if there is any change in your medical condition (cold, fever, infection).  If you are being discharged the day of surgery, you will not be allowed to drive home. You will need a responsible individual to drive you home and stay with you for 24 hours after surgery.   If you are taking public transportation, you will need to have a responsible individual with you.  If you are being admitted to the hospital overnight, leave your suitcase in the car. After surgery it may be  brought to your room.  In case of increased patient census, it may be necessary for you, the patient, to continue your postoperative care in the Same Day Surgery department.  After surgery, you can help prevent lung complications by doing breathing exercises.  Take deep breaths and cough every 1-2 hours. Your doctor may order a device called an Incentive Spirometer to help you take deep breaths. When coughing or sneezing, hold a pillow firmly against your incision with both  hands. This is called "splinting." Doing this helps protect your incision. It also decreases belly discomfort.  Surgery Visitation Policy:  Patients undergoing a surgery or procedure may have two family members or support persons with them as long as the person is not COVID-19 positive or experiencing its symptoms.   Inpatient Visitation:    Visiting hours are 7 a.m. to 8 p.m. Up to four visitors are allowed at one time in a patient room. The visitors may rotate out with other people during the day. One designated support person (adult) may remain overnight.  Please call the Pre-admissions Testing Dept. at [phone] if you have any questions about these instructions.     Preparing for Surgery with CHLORHEXIDINE GLUCONATE (CHG) Soap  Chlorhexidine Gluconate (CHG) Soap  o An antiseptic cleaner that kills germs and bonds with the skin to continue killing germs even after washing  o Used for showering the night before surgery and morning of surgery  Before surgery, you can play an important role by reducing the number of germs on your skin.  CHG (Chlorhexidine gluconate) soap is an antiseptic cleanser which kills germs and bonds with the skin to continue killing germs even after washing.  Please do not use if you have an allergy to CHG or antibacterial soaps. If your skin becomes reddened/irritated stop using the CHG.  1. Shower the NIGHT BEFORE SURGERY and the MORNING OF SURGERY with CHG soap.  2. If you choose to wash your hair, wash your hair first as usual with your normal shampoo.  3. After shampooing, rinse your hair and body thoroughly to remove the shampoo.  4. Use CHG as you would any other liquid soap. You can apply CHG directly to the skin and wash gently with a scrungie or a clean washcloth.  5. Apply the CHG soap to your body only from the neck down. Do not use on open wounds or open sores. Avoid contact with your eyes, ears, mouth, and genitals (private parts).  Wash face and genitals (private parts) with your normal soap.  6. Wash thoroughly, paying special attention to the area where your surgery will be performed.  7. Thoroughly rinse your body with warm water.  8. Do not shower/wash with your normal soap after using and rinsing off the CHG soap.  9. Pat yourself dry with a clean towel.  10. Wear clean pajamas to bed the night before surgery.  12. Place clean sheets on your bed the night of your first shower and do not sleep with pets.  13. Shower again with the CHG soap on the day of surgery prior to arriving at the hospital.  14. Do not apply any deodorants/lotions/powders.  15. Please wear clean clothes to the hospital.

## 2023-06-27 NOTE — Progress Notes (Signed)
Safety precautions to be maintained throughout the outpatient stay will include: orient to surroundings, keep bed in low position, maintain call bell within reach at all times, provide assistance with transfer out of bed and ambulation.  

## 2023-06-27 NOTE — Progress Notes (Signed)
PROVIDER NOTE: Interpretation of information contained herein should be left to medically-trained personnel. Specific patient instructions are provided elsewhere under "Patient Instructions" section of medical record. This document was created in part using STT-dictation technology, any transcriptional errors that may result from this process are unintentional.  Patient: Anthony Guerrero Type: Established DOB: 21-Jul-1961 MRN: [id number] PCP: Anthony Mina, MD  Service: Procedure DOS: 06/27/2023 Setting: Ambulatory Location: Ambulatory outpatient facility Delivery: Face-to-face Provider: Edward Jolly, MD Specialty: Interventional Pain Management Specialty designation: 09 Location: Outpatient facility Ref. Prov.: Anthony Mina, MD       Interventional Therapy   Procedure: Cervical Epidural Steroid injection (CESI) (Interlaminar) #2  Laterality: Right  Level: C7-T1 Imaging: Fluoroscopy-assisted DOS: 06/27/2023  Performed by: Anthony Jolly, MD Anesthesia: Local anesthesia (1-2% Lidocaine)  Purpose: Diagnostic/Therapeutic Indications: Cervicalgia, cervical radicular pain, degenerative disc disease, severe enough to impact quality of life or function. 1. Cervical radicular pain   2. Chronic pain syndrome    NAS-11 score:   Pre-procedure: 8 /10   Post-procedure: 6 /10      Position  Prep  Materials:  Location setting: Procedure suite Position: Prone, on modified reverse trendelenburg to facilitate breathing, with head in head-cradle. Pillows positioned under chest (below chin-level) with cervical spine flexed. Safety Precautions: Patient was assessed for positional comfort and pressure points before starting the procedure. Prepping solution: DuraPrep (Iodine Povacrylex [0.7% available iodine] and Isopropyl Alcohol, 74% w/w) Prep Area: Entire  cervicothoracic region Approach: percutaneous, paramedial Intended target: Posterior cervical epidural space Materials Procedure:  Tray:  Epidural Needle(s): Epidural (Tuohy) Qty: 1 Length: (90mm) 3.5-inch Gauge: 22G   Pre-op H&P Assessment:  Anthony Guerrero is a 62 y.o. (year old), male patient, seen today for interventional treatment. He  has a past surgical history that includes Fracture surgery; Appendectomy; Toe Surgery; and Spinal cord stimulator trial. Anthony Guerrero has a current medication list which includes the following prescription(s): celecoxib, gabapentin, latanoprost, lidocaine, and methocarbamol. His primarily concern today is the Neck Pain  Initial Vital Signs:  Pulse/HCG Rate: 61ECG Heart Rate: 64 Temp: 98.4 F (36.9 C) Resp: 17 BP: 110/76 SpO2: 100 %  BMI: Estimated body mass index is 27.76 kg/m as calculated from the following:   Height as of this encounter: 5\' 6"  (1.676 m).   Weight as of this encounter: 172 lb (78 kg).  Risk Assessment: Allergies: Reviewed. He has No Known Allergies.  Allergy Precautions: None required Coagulopathies: Reviewed. None identified.  Blood-thinner therapy: None at this time Active Infection(s): Reviewed. None identified. Anthony Guerrero is afebrile  Site Confirmation: Anthony Guerrero was asked to confirm the procedure and laterality before marking the site Procedure checklist: Completed Consent: Before the procedure and under the influence of no sedative(s), amnesic(s), or anxiolytics, the patient was informed of the treatment options, risks and possible complications. To fulfill our ethical and legal obligations, as recommended by the American Medical Association's Code of Ethics, I have informed the patient of my clinical impression; the nature and purpose of the treatment or procedure; the risks, benefits, and possible complications of the intervention; the alternatives, including doing nothing; the risk(s) and benefit(s) of the alternative treatment(s) or procedure(s); and the risk(s) and benefit(s) of doing nothing. The patient was provided information about the general  risks and possible complications associated with the procedure. These may include, but are not limited to: failure to achieve desired goals, infection, bleeding, organ or nerve damage, allergic reactions, paralysis, and death. In addition, the patient was informed of those risks and complications associated  to Spine-related procedures, such as failure to decrease pain; infection (i.e.: Meningitis, epidural or intraspinal abscess); bleeding (i.e.: epidural hematoma, subarachnoid hemorrhage, or any other type of intraspinal or peri-dural bleeding); organ or nerve damage (i.e.: Any type of peripheral nerve, nerve root, or spinal cord injury) with subsequent damage to sensory, motor, and/or autonomic systems, resulting in permanent pain, numbness, and/or weakness of one or several areas of the body; allergic reactions; (i.e.: anaphylactic reaction); and/or death. Furthermore, the patient was informed of those risks and complications associated with the medications. These include, but are not limited to: allergic reactions (i.e.: anaphylactic or anaphylactoid reaction(s)); adrenal axis suppression; blood sugar elevation that in diabetics may result in ketoacidosis or comma; water retention that in patients with history of congestive heart failure may result in shortness of breath, pulmonary edema, and decompensation with resultant heart failure; weight gain; swelling or edema; medication-induced neural toxicity; particulate matter embolism and blood vessel occlusion with resultant organ, and/or nervous system infarction; and/or aseptic necrosis of one or more joints. Finally, the patient was informed that Medicine is not an exact science; therefore, there is also the possibility of unforeseen or unpredictable risks and/or possible complications that may result in a catastrophic outcome. The patient indicated having understood very clearly. We have given the patient no guarantees and we have made no promises. Enough  time was given to the patient to ask questions, all of which were answered to the patient's satisfaction. Anthony Guerrero has indicated that he wanted to continue with the procedure. Attestation: I, the ordering provider, attest that I have discussed with the patient the benefits, risks, side-effects, alternatives, likelihood of achieving goals, and potential problems during recovery for the procedure that I have provided informed consent. Date  Time: 06/27/2023 10:24 AM   Pre-Procedure Preparation:  Monitoring: As per clinic protocol. Respiration, ETCO2, SpO2, BP, heart rate and rhythm monitor placed and checked for adequate function Safety Precautions: Patient was assessed for positional comfort and pressure points before starting the procedure. Time-out: I initiated and conducted the "Time-out" before starting the procedure, as per protocol. The patient was asked to participate by confirming the accuracy of the "Time Out" information. Verification of the correct person, site, and procedure were performed and confirmed by me, the nursing staff, and the patient. "Time-out" conducted as per Joint Commission's Universal Protocol (UP.01.01.01). Time: 1118 Start Time: 1118 hrs.  Description  Narrative of Procedure:          Rationale (medical necessity): procedure needed and proper for the diagnosis and/or treatment of the patient's medical symptoms and needs. Start Time: 1118 hrs. Safety Precautions: Aspiration looking for blood return was conducted prior to all injections. At no point did we inject any substances, as a needle was being advanced. No attempts were made at seeking any paresthesias. Safe injection practices and needle disposal techniques used. Medications properly checked for expiration dates. SDV (single dose vial) medications used. Description of procedure: Protocol guidelines were followed. The patient was assisted into a comfortable position. The target area was identified and the area  prepped in the usual manner. Skin & deeper tissues infiltrated with local anesthetic. Appropriate amount of time allowed to pass for local anesthetics to take effect. Using fluoroscopic guidance, the epidural needle was introduced through the skin, ipsilateral to the reported pain, and advanced to the target area. Posterior laminar os was contacted and the needle walked caudad, until the lamina was cleared. The ligamentum flavum was engaged and the epidural space identified using "loss-of-resistance technique"  with 2-3 ml of PF-NaCl (0.9% NSS), in a 5cc dedicated LOR syringe. (See "Imaging guidance" below for use of contrast details.) Once proper needle placement was secured, and negative aspiration confirmed, the solution was injected in intermittent fashion, asking for systemic symptoms every 0.5cc. The needles were then removed and the area cleansed, making sure to leave some of the prepping solution back to take advantage of its long term bactericidal properties.  3 cc solution made of 1 cc of preservative-free saline, 1 cc of 0.2% ropivacaine, 1 cc of Decadron 10 mg/cc.   Vitals:   06/27/23 1113 06/27/23 1115 06/27/23 1119 06/27/23 1123  BP: 117/83 121/81 125/81 129/77  Pulse:      Resp: 17 17 16 15   Temp:      TempSrc:      SpO2: 100% 100% 100% 99%  Weight:      Height:         End Time: 1122 hrs.  Imaging Guidance (Spinal):          Type of Imaging Technique: Fluoroscopy Guidance (Spinal) Indication(s): Assistance in needle guidance and placement for procedures requiring needle placement in or near specific anatomical locations not easily accessible without such assistance. Exposure Time: Please see nurses notes. Contrast: Before injecting any contrast, we confirmed that the patient did not have an allergy to iodine, shellfish, or radiological contrast. Once satisfactory needle placement was completed at the desired level, radiological contrast was injected. Contrast injected under live  fluoroscopy. No contrast complications. See chart for type and volume of contrast used. Fluoroscopic Guidance: I was personally present during the use of fluoroscopy. "Tunnel Vision Technique" used to obtain the best possible view of the target area. Parallax error corrected before commencing the procedure. "Direction-depth-direction" technique used to introduce the needle under continuous pulsed fluoroscopy. Once target was reached, antero-posterior, oblique, and lateral fluoroscopic projection used confirm needle placement in all planes. Images permanently stored in EMR. Interpretation: I personally interpreted the imaging intraoperatively. Adequate needle placement confirmed in multiple planes. Appropriate spread of contrast into desired area was observed. No evidence of afferent or efferent intravascular uptake. No intrathecal or subarachnoid spread observed. Permanent images saved into the patient's record.  Post-operative Assessment:  Post-procedure Vital Signs:  Pulse/HCG Rate: 61(!) 57 Temp: 98.4 F (36.9 C) Resp: 15 BP: 129/77 SpO2: 99 %  EBL: None  Complications: No immediate post-treatment complications observed by team, or reported by patient.  Note: The patient tolerated the entire procedure well. A repeat set of vitals were taken after the procedure and the patient was kept under observation following institutional policy, for this type of procedure. Post-procedural neurological assessment was performed, showing return to baseline, prior to discharge. The patient was provided with post-procedure discharge instructions, including a section on how to identify potential problems. Should any problems arise concerning this procedure, the patient was given instructions to immediately contact us, at any time, without hesitation. In any case, we plan to contact the patient by telephone for a follow-up status report regarding this interventional procedure.  Comments:  No additional relevant  information.  Plan of Care (POC)  Orders:  Orders Placed This Encounter  Procedures   DG PAIN CLINIC C-ARM 1-60 MIN NO REPORT    Intraoperative interpretation by procedural physician at [facility].    Standing Status:   Standing    Number of Occurrences:   1    Order Specific Question:   Reason for exam:    Answer:   Assistance in  needle guidance and placement for procedures requiring needle placement in or near specific anatomical locations not easily accessible without such assistance.     Medications ordered for procedure: Meds ordered this encounter  Medications   iohexol (OMNIPAQUE) 180 MG/ML injection 10 mL    Must be Myelogram-compatible. If not available, you may substitute with a water-soluble, non-ionic, hypoallergenic, myelogram-compatible radiological contrast medium.   lidocaine (XYLOCAINE) 2 % (with pres) injection 400 mg   ropivacaine (PF) 2 mg/mL (0.2%) (NAROPIN) injection 1 mL   sodium chloride flush (NS) 0.9 % injection 1 mL   dexamethasone (DECADRON) injection 10 mg   Medications administered: We administered iohexol, lidocaine, ropivacaine (PF) 2 mg/mL (0.2%), sodium chloride flush, and dexamethasone.  See the medical record for exact dosing, route, and time of administration.  Follow-up plan:   Return keep scheduled appt.       Right L5-S1 ESI, Right C7-T1 ESI 04/06/23, 06/27/23      Recent Visits Date Type Provider Dept  06/21/23 Office Visit Anthony Jolly, MD [facility]  06/07/23 Office Visit Anthony Jolly, MD [facility]  05/03/23 Procedure visit Anthony Jolly, MD [facility]  04/25/23 Procedure visit Anthony Jolly, MD [facility]  04/06/23 Procedure visit Anthony Jolly, MD [facility]  Showing recent visits within past 90 days and meeting all other requirements Today's Visits Date Type Provider Dept  06/27/23 Procedure visit Anthony Jolly, MD [facility]  Showing today's  visits and meeting all other requirements Future Appointments Date Type Provider Dept  07/14/23 Appointment Anthony Jolly, MD [facility]  Showing future appointments within next 90 days and meeting all other requirements  Disposition: Discharge home  Discharge (Date  Time): 06/27/2023; 1132 hrs.   Primary Care Physician: Anthony Mina, MD Location: [facility] Note by: Anthony Jolly, MD (TTS technology used. I apologize for any typographical errors that were not detected and corrected.) Date: 06/27/2023; Time: 11:58 AM  Disclaimer:  Medicine is not an Visual merchandiser. The only guarantee in medicine is that nothing is guaranteed. It is important to note that the decision to proceed with this intervention was based on the information collected from the patient. The Data and conclusions were drawn from the patient's questionnaire, the interview, and the physical examination. Because the information was provided in large part by the patient, it cannot be guaranteed that it has not been purposely or unconsciously manipulated. Every effort has been made to obtain as much relevant data as possible for this evaluation. It is important to note that the conclusions that lead to this procedure are derived in large part from the available data. Always take into account that the treatment will also be dependent on availability of resources and existing treatment guidelines, considered by other Pain Management Practitioners as being common knowledge and practice, at the time of the intervention. For Medico-Legal purposes, it is also important to point out that variation in procedural techniques and pharmacological choices are the acceptable norm. The indications, contraindications, technique, and results of the above procedure should only be interpreted and judged by a Board-Certified Interventional Pain Specialist with extensive familiarity and expertise in the same exact  procedure and technique.

## 2023-06-28 ENCOUNTER — Telehealth: Payer: Self-pay

## 2023-06-28 NOTE — Telephone Encounter (Signed)
Post procedure follow up..  Patient states he is doing good 

## 2023-07-05 ENCOUNTER — Ambulatory Visit: Payer: BLUE CROSS/BLUE SHIELD | Admitting: Anesthesiology

## 2023-07-05 ENCOUNTER — Other Ambulatory Visit: Payer: Self-pay

## 2023-07-05 ENCOUNTER — Encounter
Admission: RE | Disposition: A | Discharge status: Home / Self Care | Payer: Self-pay | Source: Home / Self Care | Attending: Pain Medicine

## 2023-07-05 ENCOUNTER — Encounter: Payer: Self-pay | Admitting: Pain Medicine

## 2023-07-05 ENCOUNTER — Ambulatory Visit
Admission: RE | Admit: 2023-07-05 | Discharge: 2023-07-05 | Disposition: A | Discharge status: Home / Self Care | Payer: BLUE CROSS/BLUE SHIELD | Attending: Pain Medicine | Admitting: Pain Medicine

## 2023-07-05 ENCOUNTER — Ambulatory Visit: Payer: BLUE CROSS/BLUE SHIELD

## 2023-07-05 DIAGNOSIS — Z87891 Personal history of nicotine dependence: Secondary | ICD-10-CM | POA: Insufficient documentation

## 2023-07-05 DIAGNOSIS — M4306 Spondylolysis, lumbar region: Secondary | ICD-10-CM | POA: Diagnosis not present

## 2023-07-05 DIAGNOSIS — M5117 Intervertebral disc disorders with radiculopathy, lumbosacral region: Secondary | ICD-10-CM | POA: Diagnosis not present

## 2023-07-05 DIAGNOSIS — G894 Chronic pain syndrome: Secondary | ICD-10-CM | POA: Insufficient documentation

## 2023-07-05 DIAGNOSIS — M48061 Spinal stenosis, lumbar region without neurogenic claudication: Secondary | ICD-10-CM | POA: Diagnosis not present

## 2023-07-05 DIAGNOSIS — M5459 Other low back pain: Secondary | ICD-10-CM | POA: Diagnosis present

## 2023-07-05 DIAGNOSIS — M5136 Other intervertebral disc degeneration, lumbar region: Secondary | ICD-10-CM | POA: Diagnosis present

## 2023-07-05 DIAGNOSIS — M4727 Other spondylosis with radiculopathy, lumbosacral region: Secondary | ICD-10-CM | POA: Insufficient documentation

## 2023-07-05 DIAGNOSIS — M4726 Other spondylosis with radiculopathy, lumbar region: Secondary | ICD-10-CM | POA: Diagnosis present

## 2023-07-05 DIAGNOSIS — M5416 Radiculopathy, lumbar region: Secondary | ICD-10-CM | POA: Diagnosis present

## 2023-07-05 DIAGNOSIS — M5137 Other intervertebral disc degeneration, lumbosacral region: Secondary | ICD-10-CM | POA: Diagnosis not present

## 2023-07-05 DIAGNOSIS — M5126 Other intervertebral disc displacement, lumbar region: Secondary | ICD-10-CM | POA: Diagnosis present

## 2023-07-05 DIAGNOSIS — M51369 Other intervertebral disc degeneration, lumbar region without mention of lumbar back pain or lower extremity pain: Secondary | ICD-10-CM | POA: Diagnosis present

## 2023-07-05 DIAGNOSIS — M4316 Spondylolisthesis, lumbar region: Secondary | ICD-10-CM | POA: Diagnosis not present

## 2023-07-05 DIAGNOSIS — R937 Abnormal findings on diagnostic imaging of other parts of musculoskeletal system: Secondary | ICD-10-CM | POA: Insufficient documentation

## 2023-07-05 DIAGNOSIS — M47817 Spondylosis without myelopathy or radiculopathy, lumbosacral region: Secondary | ICD-10-CM | POA: Diagnosis present

## 2023-07-05 DIAGNOSIS — G8929 Other chronic pain: Secondary | ICD-10-CM | POA: Diagnosis present

## 2023-07-05 DIAGNOSIS — M545 Low back pain, unspecified: Secondary | ICD-10-CM | POA: Diagnosis present

## 2023-07-05 DIAGNOSIS — M51379 Other intervertebral disc degeneration, lumbosacral region without mention of lumbar back pain or lower extremity pain: Secondary | ICD-10-CM | POA: Diagnosis present

## 2023-07-05 DIAGNOSIS — M5116 Intervertebral disc disorders with radiculopathy, lumbar region: Secondary | ICD-10-CM | POA: Diagnosis present

## 2023-07-05 HISTORY — PX: PULSE GENERATOR IMPLANT: SHX5370

## 2023-07-05 LAB — CBC
HCT: 43.6 % (ref 39.0–52.0)
Hemoglobin: 14.5 g/dL (ref 13.0–17.0)
MCH: 28.5 pg (ref 26.0–34.0)
MCHC: 33.3 g/dL (ref 30.0–36.0)
MCV: 85.8 fL (ref 80.0–100.0)
Platelets: 201 10*3/uL (ref 150–400)
RBC: 5.08 MIL/uL (ref 4.22–5.81)
RDW: 13.9 % (ref 11.5–15.5)
WBC: 4.4 10*3/uL (ref 4.0–10.5)
nRBC: 0 % (ref 0.0–0.2)

## 2023-07-05 SURGERY — BILATERAL PULSE GENERATOR IMPLANT
Anesthesia: General | Site: Back

## 2023-07-05 MED ORDER — FENTANYL CITRATE (PF) 100 MCG/2ML IJ SOLN
INTRAMUSCULAR | Status: AC
  Filled 2023-07-05: qty 2

## 2023-07-05 MED ORDER — ORAL CARE MOUTH RINSE: 15.0000 mL | Freq: Once | OROMUCOSAL | Status: AC

## 2023-07-05 MED ORDER — ONDANSETRON HCL 4 MG/2ML IJ SOLN: 4.0000 mg | Freq: Once | INTRAMUSCULAR | Status: DC | PRN

## 2023-07-05 MED ORDER — HYDROGEN PEROXIDE 3 % EX SOLN
CUTANEOUS | Status: DC | PRN
Start: 2023-07-05 — End: 2023-07-05
  Administered 2023-07-05: 1

## 2023-07-05 MED ORDER — DEXMEDETOMIDINE HCL IN NACL 200 MCG/50ML IV SOLN
INTRAVENOUS | Status: DC | PRN
Start: 2023-07-05 — End: 2023-07-05
  Administered 2023-07-05: 8 ug via INTRAVENOUS
  Administered 2023-07-05: 4 ug via INTRAVENOUS
  Administered 2023-07-05: 8 ug via INTRAVENOUS

## 2023-07-05 MED ORDER — POVIDONE-IODINE 10 % EX SOLN
CUTANEOUS | Status: DC | PRN
Start: 2023-07-05 — End: 2023-07-05
  Administered 2023-07-05: 1 via TOPICAL

## 2023-07-05 MED ORDER — OXYCODONE HCL 5 MG PO TABS
5.0000 mg | ORAL_TABLET | Freq: Once | ORAL | Status: AC | PRN
  Administered 2023-07-05: 5 mg via ORAL

## 2023-07-05 MED ORDER — LACTATED RINGERS IV SOLN: INTRAVENOUS | Status: DC

## 2023-07-05 MED ORDER — CEFAZOLIN SODIUM-DEXTROSE 1-4 GM/50ML-% IV SOLN
1.0000 g | INTRAVENOUS | Status: AC
  Administered 2023-07-05: 1 g via INTRAVENOUS

## 2023-07-05 MED ORDER — PROPOFOL 500 MG/50ML IV EMUL
INTRAVENOUS | Status: DC | PRN
  Administered 2023-07-05 (×2): 30 mg via INTRAVENOUS
  Administered 2023-07-05: 10 ug/kg/min via INTRAVENOUS

## 2023-07-05 MED ORDER — PROPOFOL 1000 MG/100ML IV EMUL
INTRAVENOUS | Status: AC
  Filled 2023-07-05: qty 100

## 2023-07-05 MED ORDER — ONDANSETRON 4 MG PO TBDP
ORAL_TABLET | ORAL | Status: AC
  Filled 2023-07-05: qty 1

## 2023-07-05 MED ORDER — BACITRACIN ZINC 500 UNIT/GM EX OINT
TOPICAL_OINTMENT | CUTANEOUS | Status: DC | PRN
  Administered 2023-07-05: 1 via TOPICAL

## 2023-07-05 MED ORDER — CEFAZOLIN SODIUM-DEXTROSE 2-4 GM/100ML-% IV SOLN
INTRAVENOUS | Status: AC
  Filled 2023-07-05: qty 100

## 2023-07-05 MED ORDER — LIDOCAINE HCL (PF) 1 % IJ SOLN
INTRAMUSCULAR | Status: AC
  Filled 2023-07-05: qty 30

## 2023-07-05 MED ORDER — CEPHALEXIN 500 MG PO CAPS: 500.0000 mg | ORAL_CAPSULE | Freq: Four times a day (QID) | ORAL | 0 refills | Status: AC

## 2023-07-05 MED ORDER — MIDAZOLAM HCL 2 MG/2ML IJ SOLN
INTRAMUSCULAR | Status: AC
  Filled 2023-07-05: qty 2

## 2023-07-05 MED ORDER — ONDANSETRON HCL 4 MG/2ML IJ SOLN
INTRAMUSCULAR | Status: DC | PRN
  Administered 2023-07-05: 4 mg via INTRAVENOUS

## 2023-07-05 MED ORDER — LIDOCAINE HCL 1 % IJ SOLN
INTRAMUSCULAR | Status: DC | PRN
  Administered 2023-07-05: 50 mL via INTRAMUSCULAR

## 2023-07-05 MED ORDER — FAMOTIDINE 20 MG PO TABS
ORAL_TABLET | ORAL | Status: AC
  Filled 2023-07-05: qty 1

## 2023-07-05 MED ORDER — OXYCODONE HCL 5 MG/5ML PO SOLN: 5.0000 mg | Freq: Once | ORAL | Status: AC | PRN

## 2023-07-05 MED ORDER — SODIUM CHLORIDE (PF) 0.9 % IJ SOLN
INTRAMUSCULAR | Status: AC
  Filled 2023-07-05: qty 100

## 2023-07-05 MED ORDER — CHLORHEXIDINE GLUCONATE 0.12 % MT SOLN
15.0000 mL | Freq: Once | OROMUCOSAL | Status: AC
  Administered 2023-07-05: 15 mL via OROMUCOSAL

## 2023-07-05 MED ORDER — CEPHALEXIN 500 MG PO CAPS: 500.0000 mg | ORAL_CAPSULE | Freq: Three times a day (TID) | ORAL | Status: DC

## 2023-07-05 MED ORDER — SODIUM CHLORIDE FLUSH 0.9 % IV SOLN
INTRAVENOUS | Status: AC
  Filled 2023-07-05: qty 10

## 2023-07-05 MED ORDER — CEFAZOLIN SODIUM-DEXTROSE 1-4 GM/50ML-% IV SOLN
INTRAVENOUS | Status: AC
  Filled 2023-07-05: qty 50

## 2023-07-05 MED ORDER — MIDAZOLAM HCL 2 MG/2ML IJ SOLN
INTRAMUSCULAR | Status: DC | PRN
  Administered 2023-07-05 (×2): 1 mg via INTRAVENOUS

## 2023-07-05 MED ORDER — BACITRACIN ZINC 500 UNIT/GM EX OINT
TOPICAL_OINTMENT | CUTANEOUS | Status: AC
  Filled 2023-07-05: qty 28.35

## 2023-07-05 MED ORDER — FENTANYL CITRATE (PF) 100 MCG/2ML IJ SOLN
25.0000 ug | INTRAMUSCULAR | Status: DC | PRN
  Administered 2023-07-05 (×2): 25 ug via INTRAVENOUS

## 2023-07-05 MED ORDER — HYDROCODONE-ACETAMINOPHEN 5-325 MG PO TABS: 1.0000 | ORAL_TABLET | Freq: Four times a day (QID) | ORAL | Status: DC | PRN

## 2023-07-05 MED ORDER — ACETAMINOPHEN 10 MG/ML IV SOLN: 1000.0000 mg | Freq: Once | INTRAVENOUS | Status: DC | PRN

## 2023-07-05 MED ORDER — CHLORHEXIDINE GLUCONATE 0.12 % MT SOLN
OROMUCOSAL | Status: AC
  Filled 2023-07-05: qty 15

## 2023-07-05 MED ORDER — ONDANSETRON 4 MG PO TBDP
4.0000 mg | ORAL_TABLET | Freq: Once | ORAL | Status: AC
  Administered 2023-07-05: 4 mg via ORAL

## 2023-07-05 MED ORDER — OXYCODONE HCL 5 MG PO TABS
ORAL_TABLET | ORAL | Status: AC
  Filled 2023-07-05: qty 1

## 2023-07-05 MED ORDER — PROPOFOL 10 MG/ML IV BOLUS
INTRAVENOUS | Status: AC
  Filled 2023-07-05: qty 20

## 2023-07-05 MED ORDER — FENTANYL CITRATE (PF) 100 MCG/2ML IJ SOLN
INTRAMUSCULAR | Status: DC | PRN
  Administered 2023-07-05 (×4): 25 ug via INTRAVENOUS

## 2023-07-05 MED ORDER — LACTATED RINGERS IV SOLN
INTRAVENOUS | Status: DC | PRN
Start: 2023-07-05 — End: 2023-07-05

## 2023-07-05 MED ORDER — BUPIVACAINE HCL (PF) 0.5 % IJ SOLN
INTRAMUSCULAR | Status: AC
  Filled 2023-07-05: qty 30

## 2023-07-05 MED ORDER — 0.9 % SODIUM CHLORIDE (POUR BTL) OPTIME
TOPICAL | Status: DC | PRN
  Administered 2023-07-05: 500 mL

## 2023-07-05 MED ORDER — ACETAMINOPHEN 500 MG PO TABS: 1000.0000 mg | ORAL_TABLET | Freq: Four times a day (QID) | ORAL | Status: DC

## 2023-07-05 MED ORDER — HYDROCODONE-ACETAMINOPHEN 5-325 MG PO TABS
1.0000 | ORAL_TABLET | Freq: Four times a day (QID) | ORAL | 0 refills | Status: AC | PRN
Start: 2023-07-05 — End: 2023-07-12

## 2023-07-05 MED ORDER — HYDROCODONE-ACETAMINOPHEN 5-325 MG PO TABS: 1.0000 | ORAL_TABLET | Freq: Four times a day (QID) | ORAL | 0 refills | Status: AC | PRN

## 2023-07-05 MED ORDER — FAMOTIDINE 20 MG PO TABS
20.0000 mg | ORAL_TABLET | Freq: Once | ORAL | Status: AC
  Administered 2023-07-05: 20 mg via ORAL

## 2023-07-05 SURGICAL SUPPLY — 58 items
BINDER ABDOMINAL 9 SM 30-45 (SOFTGOODS) IMPLANT
BLADE SURG 15 STRL LF DISP TIS (BLADE) ×1 IMPLANT
BLADE SURG 15 STRL SS (BLADE) ×1
CHARGING BELT IMPLANT
CNTNR URN SCR LID CUP LEK RST (MISCELLANEOUS) ×1 IMPLANT
COMMUNICATOR CASE IMPLANT
CONT SPEC 4OZ STRL OR WHT (MISCELLANEOUS) ×1
CONTROLLER HANDSET COMM KIT (NEUROSURGERY SUPPLIES) IMPLANT
DRAPE C-ARM XRAY 36X54 (DRAPES) ×1 IMPLANT
DRAPE CAMERA CLOSED 9X96 (DRAPES) ×1 IMPLANT
DRAPE INCISE IOBAN 66X45 STRL (DRAPES) ×1 IMPLANT
DRSG TEGADERM 4X4.75 (GAUZE/BANDAGES/DRESSINGS) ×2 IMPLANT
DRSG TELFA 3X8 NADH STRL (GAUZE/BANDAGES/DRESSINGS) ×2 IMPLANT
DURAPREP 26ML APPLICATOR (WOUND CARE) ×1 IMPLANT
ELECT REM PT RETURN 9FT ADLT (ELECTROSURGICAL) ×1
ELECTRODE REM PT RTRN 9FT ADLT (ELECTROSURGICAL) ×1 IMPLANT
ENVELOPE ABSORB ANTIBACTERIAL (Mesh General) ×1 IMPLANT
GAUZE 4X4 16PLY ~~LOC~~+RFID DBL (SPONGE) ×1 IMPLANT
GLOVE BIO SURGEON STRL SZ8 (GLOVE) ×1 IMPLANT
GLOVE SURG XRAY 8.5 LX (GLOVE) ×1 IMPLANT
GOWN STRL REUS W/ TWL LRG LVL3 (GOWN DISPOSABLE) ×1 IMPLANT
GOWN STRL REUS W/ TWL XL LVL3 (GOWN DISPOSABLE) ×1 IMPLANT
GOWN STRL REUS W/TWL LRG LVL3 (GOWN DISPOSABLE) ×1
GOWN STRL REUS W/TWL XL LVL3 (GOWN DISPOSABLE) ×1
KIT LEAD (Lead) IMPLANT
KIT TURNOVER KIT A (KITS) ×1 IMPLANT
LABEL OR SOLS (LABEL) ×1 IMPLANT
MANIFOLD NEPTUNE II (INSTRUMENTS) ×1 IMPLANT
NDL HYPO 25X1 1.5 SAFETY (NEEDLE) ×1 IMPLANT
NDL SPNL 22GX3.5 QUINCKE BK (NEEDLE) ×1 IMPLANT
NEEDLE HYPO 25X1 1.5 SAFETY (NEEDLE) ×1
NEEDLE SPNL 22GX3.5 QUINCKE BK (NEEDLE) ×1
NEUROSTIM BELT FIXATION MED (NEUROSURGERY SUPPLIES) IMPLANT
NEUROSTIM INCEPTIV (Neuro Prosthesis/Implant) IMPLANT
NEUROSTIMULATOR INCEPTIV (Neuro Prosthesis/Implant) ×1 IMPLANT
PACK BASIN MINOR ARMC (MISCELLANEOUS) ×1 IMPLANT
PACK UNIVERSAL (MISCELLANEOUS) ×1 IMPLANT
PATIENT CONTROLLER IMPLANT
POUCH TYRX ANTIBAC NEURO MED (Mesh General) IMPLANT
PROGRAMMER AND COMM CASE (NEUROSURGERY SUPPLIES) IMPLANT
RECHARGER SYSTEM (NEUROSURGERY SUPPLIES) IMPLANT
Recharger Kit IMPLANT
SOL PREP PVP 2OZ (MISCELLANEOUS) ×2
SOLUTION PREP PVP 2OZ (MISCELLANEOUS) ×1 IMPLANT
SPIKE FLUID TRANSFER (MISCELLANEOUS) ×3 IMPLANT
STAPLER SKIN PROX 35W (STAPLE) ×1 IMPLANT
STIMULATOR WIRELESS EXTERNAL (NEUROSURGERY SUPPLIES) IMPLANT
SUT SILK 0 SH 30 (SUTURE) ×1 IMPLANT
SUT SILK 2 0 SH (SUTURE) ×1 IMPLANT
SUT VIC AB 2-0 SH 27 (SUTURE) ×3
SUT VIC AB 2-0 SH 27XBRD (SUTURE) ×2 IMPLANT
SWABSTK COMLB BENZOIN TINCTURE (MISCELLANEOUS) ×1 IMPLANT
SYR 10ML LL (SYRINGE) ×2 IMPLANT
SYR EPIDURAL 5ML GLASS (SYRINGE) ×1 IMPLANT
TRAP FLUID SMOKE EVACUATOR (MISCELLANEOUS) ×1 IMPLANT
WATER STERILE IRR 1000ML POUR (IV SOLUTION) ×1 IMPLANT
WATER STERILE IRR 500ML POUR (IV SOLUTION) ×1 IMPLANT
Wireless External Neurostimulator IMPLANT
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
## 2023-07-05 NOTE — Anesthesia Postprocedure Evaluation (Signed)
Anesthesia Post Note  Patient: Anthony Guerrero  Procedure(s) Performed: SPINAL CORD STIMULATOR PERMANANT  IMPLANT (Back)  Patient location during evaluation: PACU Anesthesia Type: General Level of consciousness: awake and alert, oriented and patient cooperative Pain management: pain level controlled Vital Signs Assessment: post-procedure vital signs reviewed and stable Respiratory status: spontaneous breathing, nonlabored ventilation and respiratory function stable Cardiovascular status: blood pressure returned to baseline and stable Postop Assessment: adequate PO intake Anesthetic complications: no   No notable events documented.   Last Vitals:  Vitals:   07/05/23 1040 07/05/23 1045  BP:  111/73  Pulse: 62 (!) 59  Resp: 10 13  Temp:  (!) 36.1 C  SpO2: 97% 99%    Last Pain:  Vitals:   07/05/23 1045  TempSrc:   PainSc: 3                  Reed Breech
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
## 2023-07-05 NOTE — Transfer of Care (Addendum)
Immediate Anesthesia Transfer of Care Note  Patient: Anthony Guerrero  Procedure(s) Performed: SPINAL CORD STIMULATOR PERMANANT  IMPLANT (Back)  Patient Location: PACU  Anesthesia Type:MAC  Level of Consciousness: drowsy  Airway & Oxygen Therapy: Patient Spontanous Breathing and Patient connected to face mask oxygen  Post-op Assessment: Report given to RN and Post -op Vital signs reviewed and stable  Post vital signs: Reviewed  Last Vitals:  Vitals Value Taken Time  BP 115/77 07/05/23 1018  Temp 75F   Pulse 57 07/05/23 1021  Resp 12 07/05/23 1021  SpO2 99 % 07/05/23 1021  Vitals shown include unfiled device data.  Last Pain:  Vitals:   07/05/23 0659  TempSrc: Temporal  PainSc: 7          Complications: No notable events documented.

## 2023-07-05 NOTE — Discharge Instructions (Addendum)
___________________________________________________________________________________________  PATIENT DISCHARGE INSTRUCTIONS FOLLOWING IMPLANTATION OF SPINAL CORD STIMULATOR GENERATOR  You will be discharged from the hospital on the same day as your surgery, after you are fully recovered. Make arrangements to be driven home. DO NOT DRIVE YOURSELF!!  The bandage over the side incision may drain a small amount of blood. This is normal; don't be alarmed. Be certain to review all warnings/precautions in the patient brochure accompanying your stimulator. Please call your referring physician to make an appointment for follow?up care.  Instructions: Food intake: Start with clear liquids (like water) and advance to regular food, as tolerated.  Physical activities:  Minimize bending, twisting, or awkward positions of the mid? and lower?back.  This is critical to prevent migration of leads, which will change stimulation patterns completely and require further surgery to correct. Avoid lifting more than: 10 Lbs. for 6 weeks. Wear the abdominal binder at all times for the next 6 weeks, except when bathing. After 6 weeks you can gradually resume activities, but continue to avoid strenuous and awkward positions of the back. Driving: You can start to drive again when you are fully recovered from the anesthesia side?effects (1?2 days). Blood thinner: Restart your blood thinner 6 hours after your procedure. (Only for those taking blood thinners) Insulin: As soon as you can eat, you may resume your normal dosing schedule. (Only for those taking insulin) Wound dressing care: Keep procedure site clean and dry. Clean wound with alcohol, twice a day for the first 7 days, starting 48 hours after the surgery.  Keep incisions clean and dry. Cover with plastic wrap and tape when showering. Do not submerge incisions in a bathtub, pool or spa. Follow-up appointment: Keep your follow-up appointment after the procedure.  Usually 6-7 days to remove the staples.  Expect: From numbing medicine (AKA: Local Anesthetics): Numbness or decrease in pain. Duration: It will depend on the type of local anesthetic used. On the average, 1 to 8 hours.  From procedure: Some discomfort is to be expected once the numbing medicine wears off. This should be minimal. Use your regular pain medicines.  Call if: You experience numbness and weakness that gets worse with time, as opposed to wearing off. New onset bowel or bladder incontinence. (Spinal procedures only) have a temperature over 101.5? are unable to urinate have increasing difficulty walking or using your legs have incisional pain that continues to increase rather than stabilize or improve have any drainage of pus from your incisions have drainage which completely saturates the bandage  Emergency Numbers: Durning business hours (Monday - Thursday, 8:00 AM - 4:00 PM) (Friday, 9:00 AM - 12:00 Noon): [phone] After hours: [phone] ____________________________________________________________________________________________    AMBULATORY SURGERY  DISCHARGE INSTRUCTIONS  The drugs that you were given will stay in your system until tomorrow so for the next 24 hours you should not:  Drive an automobile Make any legal decisions Drink any alcoholic beverage  You may resume regular meals tomorrow.  Today it is better to start with liquids and gradually work up to solid foods.  You may eat anything you prefer, but it is better to start with liquids, then soup and crackers, and gradually work up to solid foods.  Please notify your doctor immediately if you have any unusual bleeding, trouble breathing, redness and pain at the surgery site, drainage, fever, or pain not relieved by medication.  Additional Instructions: Use at all times for initial 3 days, and then only during activities requiring bending at the waist.  Please contact your physician with any  problems or Same Day Surgery at [phone], Monday through Friday 6 am to 4 pm, or [place] [facility] number at [phone].

## 2023-07-05 NOTE — Anesthesia Preprocedure Evaluation (Addendum)
Anesthesia Evaluation  Patient identified by MRN, date of birth, ID band Patient awake    Reviewed: Allergy & Precautions, NPO status , Patient's Chart, lab work & pertinent test results  History of Anesthesia Complications Negative for: history of anesthetic complications  Airway Mallampati: III   Neck ROM: Full    Dental  (+) Missing   Pulmonary former smoker (quit 29 years ago)   Pulmonary exam normal breath sounds clear to auscultation       Cardiovascular Exercise Tolerance: Good negative cardio ROS Normal cardiovascular exam Rhythm:Regular Rate:Normal     Neuro/Psych Chronic pain    GI/Hepatic negative GI ROS,,,  Endo/Other  negative endocrine ROS    Renal/GU negative Renal ROS     Musculoskeletal   Abdominal   Peds  Hematology negative hematology ROS (+)   Anesthesia Other Findings   Reproductive/Obstetrics                             Anesthesia Physical Anesthesia Plan  ASA: 2  Anesthesia Plan: General   Post-op Pain Management:    Induction: Intravenous  PONV Risk Score and Plan: 2 and Propofol infusion, TIVA and Treatment may vary due to age or medical condition  Airway Management Planned: Natural Airway  Additional Equipment:   Intra-op Plan:   Post-operative Plan:   Informed Consent: I have reviewed the patients History and Physical, chart, labs and discussed the procedure including the risks, benefits and alternatives for the proposed anesthesia with the patient or authorized representative who has indicated his/her understanding and acceptance.       Plan Discussed with: CRNA  Anesthesia Plan Comments: (LMA/GETA backup discussed.  Patient consented for risks of anesthesia including but not limited to:  - adverse reactions to medications - damage to eyes, teeth, lips or other oral mucosa - nerve damage due to positioning  - sore throat or  hoarseness - damage to heart, brain, nerves, lungs, other parts of body or loss of life  Informed patient about role of CRNA in peri- and intra-operative care.  Patient voiced understanding.)       Anesthesia Quick Evaluation

## 2023-07-05 NOTE — Brief Op Note (Signed)
Patient: Anthony Guerrero  (62 y.o. male)  Date: 07/05/2023  Time: 10:23 AM  Diagnosis Pre-op: LOWER BACKPAIN, LUMBER RADICULOPHY Post-op: s/p LOWER BACKPAIN, LUMBER RADICULOPHY Procedure: SPINAL CORD STIMULATOR PERMANANT  IMPLANT: [phone] (CPT) Operating Room: [facility] OR ROOM 03  Surgeon: Delano Metz, MD   Assistant(s): None  Anesthesia: General  Anesthesia Staff:  Anesthesiologist: Reed Breech, MD CRNA: Mathews Argyle, CRNA; Merlene Pulling, CRNA EBL:  Minimal  Blood Administered: None Drains: None Meds ordered this encounter  Medications   lactated ringers infusion   OR Linked Order Group    oxyCODONE (Oxy IR/ROXICODONE) immediate release tablet 5 mg    oxyCODONE (ROXICODONE) 5 MG/5ML solution 5 mg   fentaNYL (SUBLIMAZE) injection 25-50 mcg   acetaminophen (OFIRMEV) IV 1,000 mg    Order Specific Question:   IV acetaminophen for PACU patients (adjunct analgesic)    Answer:   Yes   ondansetron (ZOFRAN) injection 4 mg   famotidine (PEPCID) tablet 20 mg   lactated ringers infusion   OR Linked Order Group    chlorhexidine (PERIDEX) 0.12 % solution 15 mL    Oral care mouth rinse   ceFAZolin (ANCEF) IVPB 1 g/50 mL premix    Order Specific Question:   Indication:    Answer:   Surgical Prophylaxis   DISCONTD: bupivacaine (MARCAINE) 30 mL, lidocaine (XYLOCAINE) 1 % 30 mL   DISCONTD: 0.9 % irrigation (POUR BTL)   DISCONTD: povidone-iodine (BETADINE) 10 % external solution   DISCONTD: hydrogen peroxide 3 % external solution   DISCONTD: bacitracin ointment   Specimen & disposition: * No specimens in log * Complete Count: YES Tourniquet: * No tourniquets in log *  Dictation: Note written in EPIC Plan of Care: Discharge to 01-Home or Self Care after PACU  Patient Disposition: F/U at [facility] as outpatient. Implants     Lead   Kit Lead - [id number] - Implanted   (Left) Spine Lumbar    Inventory item: KIT LEAD Model/Cat number: [id number]   Manufacturer: [employer]  NEUROMOD PAIN MGMT Lot number: [id number]   Area Of Implantation: Spine Lumbar      As of 07/05/2023     Status: Implanted               Kit Lead - [id number] - Implanted   (Right) Spine Lumbar    Inventory item: KIT LEAD Model/Cat number: [id number]   Manufacturer: [employer] NEUROMOD PAIN MGMT Lot number: [id number]   Area Of Implantation: Spine Lumbar      As of 07/05/2023     Status: Implanted               Mesh General   Envelope Absorb Antibacterial - [id number] - Implanted   (Right) Spine Lumbar    Inventory item: ENVELOPE ABSORB ANTIBACTERIAL Model/Cat number: [id number]   Manufacturer: [employer] NEUROMOD PAIN MGMT Lot number: [id number]    As of 07/05/2023     Status: Implanted               Type Not Specified   Inceptiv Implantable Neurostimulator - Implanted   (Right) Spine Lumbar    Model/Cat number: [id number] Serial number: [id number]   Manufacturer: [employer] [place] [employer]      As of 07/05/2023     Status: Implanted

## 2023-07-06 ENCOUNTER — Telehealth: Payer: Self-pay | Admitting: Student in an Organized Health Care Education/Training Program

## 2023-07-06 NOTE — Telephone Encounter (Signed)
Patient notified we will do a work note for 07-05-23 and 07-06-23.

## 2023-07-06 NOTE — Telephone Encounter (Signed)
Patient wants a work note, patient had procedure done yesterday morning with Lateef. Not sure when he will come by to pick up work note. TY

## 2023-07-07 ENCOUNTER — Encounter: Payer: Self-pay | Admitting: Pain Medicine

## 2023-07-07 ENCOUNTER — Telehealth: Payer: Self-pay

## 2023-07-07 NOTE — Telephone Encounter (Signed)
Called patient to check on his Post surgery for SCS implant.  Left message for him to call back.

## 2023-07-14 ENCOUNTER — Encounter: Payer: Self-pay | Admitting: Student in an Organized Health Care Education/Training Program

## 2023-07-14 ENCOUNTER — Ambulatory Visit
Payer: BLUE CROSS/BLUE SHIELD | Attending: Student in an Organized Health Care Education/Training Program | Admitting: Student in an Organized Health Care Education/Training Program

## 2023-07-14 VITALS — BP 124/86 | HR 56 | Temp 97.4°F | Resp 16 | Ht 66.0 in | Wt 167.0 lb

## 2023-07-14 DIAGNOSIS — G8929 Other chronic pain: Secondary | ICD-10-CM

## 2023-07-14 DIAGNOSIS — M47816 Spondylosis without myelopathy or radiculopathy, lumbar region: Secondary | ICD-10-CM

## 2023-07-14 DIAGNOSIS — M5416 Radiculopathy, lumbar region: Secondary | ICD-10-CM

## 2023-07-14 DIAGNOSIS — M5116 Intervertebral disc disorders with radiculopathy, lumbar region: Secondary | ICD-10-CM

## 2023-07-14 NOTE — Patient Instructions (Signed)

## 2023-07-14 NOTE — Progress Notes (Signed)
PROVIDER NOTE: Information contained herein reflects review and annotations entered in association with encounter. Interpretation of such information and data should be left to medically-trained personnel. Information provided to patient can be located elsewhere in the medical record under "Patient Instructions". Document created using STT-dictation technology, any transcriptional errors that may result from process are unintentional.    Patient: Anthony Guerrero  Service Category: E/M  Provider: Edward Jolly, MD  DOB: 02-18-1961  DOS: 07/14/2023  Referring Provider: Jerl Mina, MD  MRN: [id number]  Specialty: Interventional Pain Management  PCP: Jerl Mina, MD  Type: Established Patient  Setting: Ambulatory outpatient    Location: Office  Delivery: Face-to-face     HPI  Mr. Anthony Guerrero, a 62 y.o. year old male, is here today because of his Lumbar radiculopathy [M54.16]. Mr. Anthony Guerrero primary complain today is Back Pain (lower)   Pain Assessment: Severity of Chronic pain is reported as a 6 /10. Location: Back Lower/right leg to the toes, with tingling in the toes. Onset: More than a month ago. Quality: Aching. Timing: Constant. Modifying factor(s): medications. Vitals:  height is 5\' 6"  (1.676 m) and weight is 167 lb (75.8 kg). His temporal temperature is 97.4 F (36.3 C) (abnormal). His blood pressure is 124/86 and his pulse is 56 (abnormal). His respiration is 16 and oxygen saturation is 100%.  BMI: Estimated body mass index is 26.95 kg/m as calculated from the following:   Height as of this encounter: 5\' 6"  (1.676 m).   Weight as of this encounter: 167 lb (75.8 kg). Last encounter: 06/21/2023. Last procedure: 06/27/2023.  Reason for encounter:  Anthony Guerrero removal  .   Anthony Guerrero is here today removal of his incision staples.  He states that he is doing well.  He has been taking his antibiotics and also cleaning his incision with antimicrobial soap twice a day.  He does not notice any  tenderness along his incisions.  Staples were removed.  Dermabond was applied.  I will have him follow-up in 4 weeks.  [employer] team is here to start and program his therapy.   ROS  Constitutional: Denies any fever or chills Gastrointestinal: No reported hemesis, hematochezia, vomiting, or acute GI distress Musculoskeletal: Denies any acute onset joint swelling, redness, loss of ROM, or weakness Neurological: No reported episodes of acute onset apraxia, aphasia, dysarthria, agnosia, amnesia, paralysis, loss of coordination, or loss of consciousness  Medication Review  HYDROcodone-acetaminophen, celecoxib, gabapentin, latanoprost, lidocaine, and methocarbamol  History Review  Allergy: Mr. Anthony Guerrero has No Known Allergies. Drug: Mr. Anthony Guerrero  reports no history of drug use. Alcohol:  reports no history of alcohol use. Tobacco:  reports that he has quit smoking. His smoking use included cigarettes. He has never used smokeless tobacco. Social: Mr. Anthony Guerrero  reports that he has quit smoking. His smoking use included cigarettes. He has never used smokeless tobacco. He reports that he does not drink alcohol and does not use drugs. Medical:  has a past medical history of Bulging lumbar disc and Sciatica. Surgical: Mr. Anthony Guerrero  has a past surgical history that includes Fracture surgery; Appendectomy; Toe Surgery; Spinal cord stimulator trial; and Pulse generator implant (N/A, 07/05/2023). Family: family history is not on file.  Laboratory Chemistry Profile   Renal No results found for: "BUN", "CREATININE", "LABCREA", "BCR", "GFR", "GFRAA", "GFRNONAA", "LABVMA", "EPIRU", "EPINEPH24HUR", "NOREPRU", "NOREPI24HUR", "DOPARU", "DOPAM24HRUR"  Hepatic No results found for: "AST", "ALT", "ALBUMIN", "ALKPHOS", "HCVAB", "AMYLASE", "LIPASE", "AMMONIA"  Electrolytes No results found for: "NA", "K", "CL", "CALCIUM", "MG", "PHOS"  Bone No results found for: "VD25OH", "VD125OH2TOT", "ZO1096EA5", "WU9811BJ4",  "25OHVITD1", "25OHVITD2", "25OHVITD3", "TESTOFREE", "TESTOSTERONE"  Inflammation (CRP: Acute Phase) (ESR: Chronic Phase) No results found for: "CRP", "ESRSEDRATE", "LATICACIDVEN"       Note: Above Lab results reviewed.  Recent Imaging Review  DG Lumbar Spine 2-3 Views CLINICAL DATA:  Elective surgery.  EXAM: LUMBAR SPINE - 2-3 Guerrero  COMPARISON:  None Available.  FINDINGS: Four fluoroscopic spot views of the lumbar spine obtained in the operating room. Placement of spinal stimulator with lead tips at the T7-T8 level. Fluoroscopy time 4 minutes 31 seconds. Dose 42.42 mGy  IMPRESSION: Intraoperative fluoroscopy with spinal stimulator in place.  Electronically Signed   By: Narda Rutherford M.D.   On: 07/05/2023 11:36 DG C-Arm 1-60 Min-No Report Fluoroscopy was utilized by the requesting physician.  No radiographic  interpretation.  Note: Reviewed        Physical Exam  General appearance: Well nourished, well developed, and well hydrated. In no apparent acute distress Mental status: Alert, oriented x 3 (person, place, & time)       Respiratory: No evidence of acute respiratory distress Eyes: PERLA Vitals: BP 124/86   Pulse (!) 56   Temp (!) 97.4 F (36.3 C) (Temporal)   Resp 16   Ht 5\' 6"  (1.676 m)   Wt 167 lb (75.8 kg)   SpO2 100%   BMI 26.95 kg/m  BMI: Estimated body mass index is 26.95 kg/m as calculated from the following:   Height as of this encounter: 5\' 6"  (1.676 m).   Weight as of this encounter: 167 lb (75.8 kg). Ideal: Ideal body weight: 63.8 kg (140 lb 10.5 oz) Adjusted ideal body weight: 68.6 kg (151 lb 3.1 oz)  Assessment   Diagnosis Status  1. Lumbar radiculopathy   2. Lumbar disc herniation with radiculopathy   3. Chronic radicular lumbar pain   4. Lumbar spondylosis    Controlled Controlled Controlled   Updated Problems: No problems updated.  Plan of Care  Follow-up in 4 weeks to see how patient is doing with spinal cord  stimulation.  Follow-up plan:   No follow-ups on file.      Right L5-S1 ESI, Right C7-T1 ESI 04/06/23, 06/27/23       Recent Visits Date Type Provider Dept  06/27/23 Procedure visit Edward Jolly, MD [facility]  06/21/23 Office Visit Edward Jolly, MD [facility]  06/07/23 Office Visit Edward Jolly, MD [facility]  05/03/23 Procedure visit Edward Jolly, MD [facility]  04/25/23 Procedure visit Edward Jolly, MD [facility]  Showing recent visits within past 90 days and meeting all other requirements Today's Visits Date Type Provider Dept  07/14/23 Office Visit Edward Jolly, MD [facility]  Showing today's visits and meeting all other requirements Future Appointments No visits were found meeting these conditions. Showing future appointments within next 90 days and meeting all other requirements  I discussed the assessment and treatment plan with the patient. The patient was provided an opportunity to ask questions and all were answered. The patient agreed with the plan and demonstrated an understanding of the instructions.  Patient advised to call back or seek an in-person evaluation if the symptoms or condition worsens.  Duration of encounter: 15 minutes.  Total time on encounter, as per AMA guidelines included both the face-to-face and non-face-to-face time personally spent by the physician and/or other qualified health care professional(s) on the day of the encounter (includes time in activities that  require the physician or other qualified health care professional and does not include time in activities normally performed by clinical staff). Physician's time may include the following activities when performed: Preparing to see the patient (e.g., pre-charting review of records, searching for previously ordered imaging, lab work, and nerve conduction tests) Review of prior analgesic pharmacotherapies. Reviewing  PMP Interpreting ordered tests (e.g., lab work, imaging, nerve conduction tests) Performing post-procedure evaluations, including interpretation of diagnostic procedures Obtaining and/or reviewing separately obtained history Performing a medically appropriate examination and/or evaluation Counseling and educating the patient/family/caregiver Ordering medications, tests, or procedures Referring and communicating with other health care professionals (when not separately reported) Documenting clinical information in the electronic or other health record Independently interpreting results (not separately reported) and communicating results to the patient/ family/caregiver Care coordination (not separately reported)  Note by: Edward Jolly, MD Date: 07/14/2023; Time: 9:28 AM

## 2023-07-25 ENCOUNTER — Telehealth: Payer: Self-pay | Admitting: Student in an Organized Health Care Education/Training Program

## 2023-07-25 NOTE — Telephone Encounter (Signed)
Called patient and notified the of [facility] phone number.
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
## 2023-07-29 ENCOUNTER — Telehealth: Payer: Self-pay | Admitting: Student in an Organized Health Care Education/Training Program

## 2023-07-29 NOTE — Telephone Encounter (Signed)
Annice Pih call from [employer] stated that their office had fax over paper work on 07/25/23 to be fill out and send back. PT remote was lost and need to be replace. Annice Pih wanted to see if doctor has look sign paperwork and has it been sent back. Please give Annice Pih a call at [phone] etx [phone] TY

## 2023-07-29 NOTE — Telephone Encounter (Signed)
Left voicemail.  Also spoke with patient.  I have reached out to Carl, voicemail left.  Patient is going to reach out to [facility] and I gave patient his number.

## 2023-07-29 NOTE — Telephone Encounter (Signed)
Will look for paperwork

## 2023-08-23 ENCOUNTER — Ambulatory Visit: Payer: BLUE CROSS/BLUE SHIELD | Admitting: Student in an Organized Health Care Education/Training Program

## 2023-08-29 ENCOUNTER — Encounter: Payer: Self-pay | Admitting: Student in an Organized Health Care Education/Training Program

## 2023-08-29 ENCOUNTER — Ambulatory Visit
Payer: BLUE CROSS/BLUE SHIELD | Attending: Student in an Organized Health Care Education/Training Program | Admitting: Student in an Organized Health Care Education/Training Program

## 2023-08-29 VITALS — BP 109/66 | HR 68 | Temp 97.1°F | Resp 14 | Wt 167.0 lb

## 2023-08-29 DIAGNOSIS — M4726 Other spondylosis with radiculopathy, lumbar region: Secondary | ICD-10-CM

## 2023-08-29 DIAGNOSIS — M5116 Intervertebral disc disorders with radiculopathy, lumbar region: Secondary | ICD-10-CM | POA: Diagnosis not present

## 2023-08-29 DIAGNOSIS — G894 Chronic pain syndrome: Secondary | ICD-10-CM | POA: Insufficient documentation

## 2023-08-29 DIAGNOSIS — M5412 Radiculopathy, cervical region: Secondary | ICD-10-CM | POA: Insufficient documentation

## 2023-08-29 DIAGNOSIS — M47816 Spondylosis without myelopathy or radiculopathy, lumbar region: Secondary | ICD-10-CM | POA: Diagnosis not present

## 2023-08-29 NOTE — Patient Instructions (Signed)
 ______________________________________________________________________    Preparing for your procedure  Appointments: If you think you may not be able to keep your appointment, call 24-48 hours in advance to cancel. We need time to make it available to others.  During your procedure appointment there will be: No Prescription Refills. No disability issues to discussed. No medication changes or discussions.  Instructions: Food intake: Avoid eating anything solid for at least 8 hours prior to your procedure. Clear liquid intake: You may take clear liquids such as water up to 2 hours prior to your procedure. (No carbonated drinks. No soda.) Transportation: Unless otherwise stated by your physician, bring a driver. (Driver cannot be a Market researcher, Pharmacist, community, or any other form of public transportation.) Morning Medicines: Except for blood thinners, take all of your other morning medications with a sip of water. Make sure to take your heart and blood pressure medicines. If your blood pressure's lower number is above 100, the case will be rescheduled. Blood thinners: Make sure to stop your blood thinners as instructed.  If you take a blood thinner, but were not instructed to stop it, call our office [phone] and ask to talk to a nurse. Not stopping a blood thinner prior to certain procedures could lead to serious complications. Diabetics on insulin: Notify the staff so that you can be scheduled 1st case in the morning. If your diabetes requires high dose insulin, take only  of your normal insulin dose the morning of the procedure and notify the staff that you have done so. Preventing infections: Shower with an antibacterial soap the morning of your procedure.  Build-up your immune system: Take 1000 mg of Vitamin C with every meal (3 times a day) the day prior to your procedure. Antibiotics: Inform the nursing staff if you are taking any antibiotics or if you have any conditions that may require antibiotics  prior to procedures. (Example: recent joint implants)   Pregnancy: If you are pregnant make sure to notify the nursing staff. Not doing so may result in injury to the fetus, including death.  Sickness: If you have a cold, fever, or any active infections, call and cancel or reschedule your procedure. Receiving steroids while having an infection may result in complications. Arrival: You must be in the facility at least 30 minutes prior to your scheduled procedure. Tardiness: Your scheduled time is also the cutoff time. If you do not arrive at least 15 minutes prior to your procedure, you will be rescheduled.  Children: Do not bring any children with you. Make arrangements to keep them home. Dress appropriately: There is always a possibility that your clothing may get soiled. Avoid long dresses. Valuables: Do not bring any jewelry or valuables.  Reasons to call and reschedule or cancel your procedure: (Following these recommendations will minimize the risk of a serious complication.) Surgeries: Avoid having procedures within 2 weeks of any surgery. (Avoid for 2 weeks before or after any surgery). Flu Shots: Avoid having procedures within 2 weeks of a flu shots or . (Avoid for 2 weeks before or after immunizations). Barium: Avoid having a procedure within 7-10 days after having had a radiological study involving the use of radiological contrast. (Myelograms, Barium swallow or enema study). Heart attacks: Avoid any elective procedures or surgeries for the initial 6 months after a "Myocardial Infarction" (Heart Attack). Blood thinners: It is imperative that you stop these medications before procedures. Let us know if you if you take any blood thinner.  Infection: Avoid procedures during or within  two weeks of an infection (including chest colds or gastrointestinal problems). Symptoms associated with infections include: Localized redness, fever, chills, night sweats or profuse sweating, burning sensation  when voiding, cough, congestion, stuffiness, runny nose, sore throat, diarrhea, nausea, vomiting, cold or Flu symptoms, recent or current infections. It is specially important if the infection is over the area that we intend to treat. Heart and lung problems: Symptoms that may suggest an active cardiopulmonary problem include: cough, chest pain, breathing difficulties or shortness of breath, dizziness, ankle swelling, uncontrolled high or unusually low blood pressure, and/or palpitations. If you are experiencing any of these symptoms, cancel your procedure and contact your primary care physician for an evaluation.  Remember:  Regular Business hours are:  Monday to Thursday 8:00 AM to 4:00 PM  Provider's Schedule: Delano Metz, MD:  Procedure days: Tuesday and Thursday 7:30 AM to 4:00 PM  Edward Jolly, MD:  Procedure days: Monday and Wednesday 7:30 AM to 4:00 PM Last  Updated: 06/07/2023 ______________________________________________________________________
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
## 2023-09-12 ENCOUNTER — Encounter: Payer: Self-pay | Admitting: Student in an Organized Health Care Education/Training Program

## 2023-09-12 ENCOUNTER — Ambulatory Visit
Payer: BLUE CROSS/BLUE SHIELD | Attending: Student in an Organized Health Care Education/Training Program | Admitting: Student in an Organized Health Care Education/Training Program

## 2023-09-12 ENCOUNTER — Ambulatory Visit
Admission: RE | Admit: 2023-09-12 | Discharge: 2023-09-12 | Disposition: A | Discharge status: Home / Self Care | Payer: BLUE CROSS/BLUE SHIELD | Source: Ambulatory Visit | Attending: Student in an Organized Health Care Education/Training Program | Admitting: Student in an Organized Health Care Education/Training Program

## 2023-09-12 VITALS — BP 129/87 | HR 70 | Temp 97.9°F | Resp 18 | Ht 66.0 in | Wt 167.0 lb

## 2023-09-12 DIAGNOSIS — G894 Chronic pain syndrome: Secondary | ICD-10-CM | POA: Diagnosis not present

## 2023-09-12 DIAGNOSIS — M5412 Radiculopathy, cervical region: Secondary | ICD-10-CM | POA: Insufficient documentation

## 2023-09-12 MED ORDER — IOHEXOL 180 MG/ML  SOLN
10.0000 mL | Freq: Once | INTRAMUSCULAR | Status: AC
  Administered 2023-09-12: 10 mL via EPIDURAL
  Filled 2023-09-12: qty 20

## 2023-09-12 MED ORDER — SODIUM CHLORIDE 0.9% FLUSH
1.0000 mL | Freq: Once | INTRAVENOUS | Status: AC
  Administered 2023-09-12: 1 mL

## 2023-09-12 MED ORDER — ROPIVACAINE HCL 2 MG/ML IJ SOLN
1.0000 mL | Freq: Once | INTRAMUSCULAR | Status: AC
  Administered 2023-09-12: 1 mL via EPIDURAL
  Filled 2023-09-12: qty 20

## 2023-09-12 MED ORDER — LIDOCAINE HCL 2 % IJ SOLN
20.0000 mL | Freq: Once | INTRAMUSCULAR | Status: AC
  Administered 2023-09-12: 100 mg
  Filled 2023-09-12: qty 40

## 2023-09-12 MED ORDER — DEXAMETHASONE SODIUM PHOSPHATE 10 MG/ML IJ SOLN
10.0000 mg | Freq: Once | INTRAMUSCULAR | Status: AC
  Administered 2023-09-12: 10 mg
  Filled 2023-09-12: qty 1

## 2023-09-12 NOTE — Progress Notes (Signed)
Safety precautions to be maintained throughout the outpatient stay will include: orient to surroundings, keep bed in low position, maintain call bell within reach at all times, provide assistance with transfer out of bed and ambulation.  

## 2023-09-12 NOTE — Patient Instructions (Signed)

## 2023-09-12 NOTE — Progress Notes (Signed)
PROVIDER NOTE: Interpretation of information contained herein should be left to medically-trained personnel. Specific patient instructions are provided elsewhere under "Patient Instructions" section of medical record. This document was created in part using STT-dictation technology, any transcriptional errors that may result from this process are unintentional.  Patient: Anthony Guerrero Type: Established DOB: [birth year] MRN: [id number] PCP: Jerl Mina, MD  Service: Procedure DOS: 09/12/2023 Setting: Ambulatory Location: Ambulatory outpatient facility Delivery: Face-to-face Provider: Edward Jolly, MD Specialty: Interventional Pain Management Specialty designation: 09 Location: Outpatient facility Ref. Prov.: Jerl Mina, MD       Interventional Therapy   Procedure: Cervical Epidural Steroid injection (CESI) (Interlaminar) #3  Laterality: Right  Level: C7-T1 Imaging: Fluoroscopy-assisted DOS: 09/12/2023  Performed by: Edward Jolly, MD Anesthesia: Local anesthesia (1-2% Lidocaine)  Purpose: Diagnostic/Therapeutic Indications: Cervicalgia, cervical radicular pain, degenerative disc disease, severe enough to impact quality of life or function. 1. Cervical radicular pain   2. Chronic pain syndrome    NAS-11 score:   Pre-procedure: 6 /10   Post-procedure: 6 /10      Position  Prep  Materials:  Location setting: Procedure suite Position: Prone, on modified reverse trendelenburg to facilitate breathing, with head in head-cradle. Pillows positioned under chest (below chin-level) with cervical spine flexed. Safety Precautions: Patient was assessed for positional comfort and pressure points before starting the procedure. Prepping solution: DuraPrep (Iodine Povacrylex [0.7% available iodine] and Isopropyl Alcohol, 74% w/w) Prep Area: Entire  cervicothoracic region Approach: percutaneous, paramedial Intended target: Posterior cervical epidural space Materials Procedure:  Tray:  Epidural Needle(s): Epidural (Tuohy) Qty: 1 Length: (90mm) 3.5-inch Gauge: 22G   Pre-op H&P Assessment:  Anthony Guerrero is a 62 y.o. (year old), male patient, seen today for interventional treatment. He  has a past surgical history that includes Fracture surgery; Appendectomy; Toe Surgery; Spinal cord stimulator trial; and Pulse generator implant (N/A, 07/05/2023). Anthony Guerrero has a current medication list which includes the following prescription(s): celecoxib, gabapentin, latanoprost, lidocaine, and methocarbamol. His primarily concern today is the Neck Pain (Right )  Initial Vital Signs:  Pulse/HCG Rate: 65ECG Heart Rate: 74 Temp: 97.9 F (36.6 C) Resp: 16 BP: 120/84 SpO2: 100 %  BMI: Estimated body mass index is 26.95 kg/m as calculated from the following:   Height as of this encounter: 5\' 6"  (1.676 m).   Weight as of this encounter: 167 lb (75.8 kg).  Risk Assessment: Allergies: Reviewed. He has No Known Allergies.  Allergy Precautions: None required Coagulopathies: Reviewed. None identified.  Blood-thinner therapy: None at this time Active Infection(s): Reviewed. None identified. Anthony Guerrero is afebrile  Site Confirmation: Anthony Guerrero was asked to confirm the procedure and laterality before marking the site Procedure checklist: Completed Consent: Before the procedure and under the influence of no sedative(s), amnesic(s), or anxiolytics, the patient was informed of the treatment options, risks and possible complications. To fulfill our ethical and legal obligations, as recommended by the American Medical Association's Code of Ethics, I have informed the patient of my clinical impression; the nature and purpose of the treatment or procedure; the risks, benefits, and possible complications of the intervention; the alternatives, including doing nothing; the risk(s) and benefit(s) of the alternative treatment(s) or procedure(s); and the risk(s) and benefit(s) of doing nothing. The  patient was provided information about the general risks and possible complications associated with the procedure. These may include, but are not limited to: failure to achieve desired goals, infection, bleeding, organ or nerve damage, allergic reactions, paralysis, and death. In addition, the patient was  informed of those risks and complications associated to Spine-related procedures, such as failure to decrease pain; infection (i.e.: Meningitis, epidural or intraspinal abscess); bleeding (i.e.: epidural hematoma, subarachnoid hemorrhage, or any other type of intraspinal or peri-dural bleeding); organ or nerve damage (i.e.: Any type of peripheral nerve, nerve root, or spinal cord injury) with subsequent damage to sensory, motor, and/or autonomic systems, resulting in permanent pain, numbness, and/or weakness of one or several areas of the body; allergic reactions; (i.e.: anaphylactic reaction); and/or death. Furthermore, the patient was informed of those risks and complications associated with the medications. These include, but are not limited to: allergic reactions (i.e.: anaphylactic or anaphylactoid reaction(s)); adrenal axis suppression; blood sugar elevation that in diabetics may result in ketoacidosis or comma; water retention that in patients with history of congestive heart failure may result in shortness of breath, pulmonary edema, and decompensation with resultant heart failure; weight gain; swelling or edema; medication-induced neural toxicity; particulate matter embolism and blood vessel occlusion with resultant organ, and/or nervous system infarction; and/or aseptic necrosis of one or more joints. Finally, the patient was informed that Medicine is not an exact science; therefore, there is also the possibility of unforeseen or unpredictable risks and/or possible complications that may result in a catastrophic outcome. The patient indicated having understood very clearly. We have given the patient no  guarantees and we have made no promises. Enough time was given to the patient to ask questions, all of which were answered to the patient's satisfaction. Anthony Guerrero has indicated that he wanted to continue with the procedure. Attestation: I, the ordering provider, attest that I have discussed with the patient the benefits, risks, side-effects, alternatives, likelihood of achieving goals, and potential problems during recovery for the procedure that I have provided informed consent. Date  Time: 09/12/2023  1:06 PM   Pre-Procedure Preparation:  Monitoring: As per clinic protocol. Respiration, ETCO2, SpO2, BP, heart rate and rhythm monitor placed and checked for adequate function Safety Precautions: Patient was assessed for positional comfort and pressure points before starting the procedure. Time-out: I initiated and conducted the "Time-out" before starting the procedure, as per protocol. The patient was asked to participate by confirming the accuracy of the "Time Out" information. Verification of the correct person, site, and procedure were performed and confirmed by me, the nursing staff, and the patient. "Time-out" conducted as per Joint Commission's Universal Protocol (UP.01.01.01). Time: 1405 Start Time: 1405 hrs.  Description  Narrative of Procedure:          Rationale (medical necessity): procedure needed and proper for the diagnosis and/or treatment of the patient's medical symptoms and needs. Start Time: 1405 hrs. Safety Precautions: Aspiration looking for blood return was conducted prior to all injections. At no point did we inject any substances, as a needle was being advanced. No attempts were made at seeking any paresthesias. Safe injection practices and needle disposal techniques used. Medications properly checked for expiration dates. SDV (single dose vial) medications used. Description of procedure: Protocol guidelines were followed. The patient was assisted into a comfortable  position. The target area was identified and the area prepped in the usual manner. Skin & deeper tissues infiltrated with local anesthetic. Appropriate amount of time allowed to pass for local anesthetics to take effect. Using fluoroscopic guidance, the epidural needle was introduced through the skin, ipsilateral to the reported pain, and advanced to the target area. Posterior laminar os was contacted and the needle walked caudad, until the lamina was cleared. The ligamentum flavum was engaged  and the epidural space identified using "loss-of-resistance technique" with 2-3 ml of PF-NaCl (0.9% NSS), in a 5cc dedicated LOR syringe. (See "Imaging guidance" below for use of contrast details.) Once proper needle placement was secured, and negative aspiration confirmed, the solution was injected in intermittent fashion, asking for systemic symptoms every 0.5cc. The needles were then removed and the area cleansed, making sure to leave some of the prepping solution back to take advantage of its long term bactericidal properties.  3 cc solution made of 1 cc of preservative-free saline, 1 cc of 0.2% ropivacaine, 1 cc of Decadron 10 mg/cc.   Vitals:   09/12/23 1333 09/12/23 1401 09/12/23 1406 09/12/23 1409  BP: 120/84 122/82 132/82 129/87  Pulse: 70     Resp: 16 18 16 18   Temp: 97.9 F (36.6 C)     TempSrc: Temporal     SpO2: 100% 100% 99% 99%  Weight: 167 lb (75.8 kg)     Height: 5\' 6"  (1.676 m)         End Time: 1409 hrs.  Imaging Guidance (Spinal):          Type of Imaging Technique: Fluoroscopy Guidance (Spinal) Indication(s): Assistance in needle guidance and placement for procedures requiring needle placement in or near specific anatomical locations not easily accessible without such assistance. Exposure Time: Please see nurses notes. Contrast: Before injecting any contrast, we confirmed that the patient did not have an allergy to iodine, shellfish, or radiological contrast. Once satisfactory  needle placement was completed at the desired level, radiological contrast was injected. Contrast injected under live fluoroscopy. No contrast complications. See chart for type and volume of contrast used. Fluoroscopic Guidance: I was personally present during the use of fluoroscopy. "Tunnel Vision Technique" used to obtain the best possible view of the target area. Parallax error corrected before commencing the procedure. "Direction-depth-direction" technique used to introduce the needle under continuous pulsed fluoroscopy. Once target was reached, antero-posterior, oblique, and lateral fluoroscopic projection used confirm needle placement in all planes. Images permanently stored in EMR. Interpretation: I personally interpreted the imaging intraoperatively. Adequate needle placement confirmed in multiple planes. Appropriate spread of contrast into desired area was observed. No evidence of afferent or efferent intravascular uptake. No intrathecal or subarachnoid spread observed. Permanent images saved into the patient's record.  Post-operative Assessment:  Post-procedure Vital Signs:  Pulse/HCG Rate: 7070 Temp: 97.9 F (36.6 C) Resp: 18 BP: 129/87 SpO2: 99 %  EBL: None  Complications: No immediate post-treatment complications observed by team, or reported by patient.  Note: The patient tolerated the entire procedure well. A repeat set of vitals were taken after the procedure and the patient was kept under observation following institutional policy, for this type of procedure. Post-procedural neurological assessment was performed, showing return to baseline, prior to discharge. The patient was provided with post-procedure discharge instructions, including a section on how to identify potential problems. Should any problems arise concerning this procedure, the patient was given instructions to immediately contact us, at any time, without hesitation. In any case, we plan to contact the patient by  telephone for a follow-up status report regarding this interventional procedure.  Comments:  No additional relevant information.  Plan of Care (POC)  Orders:  Orders Placed This Encounter  Procedures   DG PAIN CLIN[facility] C-ARM 1-60 MIN NO REPORT    Intraoperative interpretation by procedural physician at [facility].    Standing Status:   Standing    Number of Occurrences:   1  Order Specific Question:   Reason for exam:    Answer:   Assistance in needle guidance and placement for procedures requiring needle placement in or near specific anatomical locations not easily accessible without such assistance.     Medications ordered for procedure: Meds ordered this encounter  Medications   iohexol (OMNIPAQUE) 180 MG/ML injection 10 mL    Must be Myelogram-compatible. If not available, you may substitute with a water-soluble, non-ionic, hypoallergenic, myelogram-compatible radiological contrast medium.   lidocaine (XYLOCAINE) 2 % (with pres) injection 400 mg   ropivacaine (PF) 2 mg/mL (0.2%) (NAROPIN) injection 1 mL   sodium chloride flush (NS) 0.9 % injection 1 mL   dexamethasone (DECADRON) injection 10 mg   Medications administered: We administered iohexol, lidocaine, ropivacaine (PF) 2 mg/mL (0.2%), sodium chloride flush, and dexamethasone.  See the medical record for exact dosing, route, and time of administration.  Follow-up plan:   Return for patient will call to schedule F2F appt prn.       Right L5-S1 ESI, Right C7-T1 ESI 04/06/23, 06/27/23, 09/12/23      Recent Visits Date Type Provider Dept  08/29/23 Office Visit Edward Jolly, MD [facility]  07/14/23 Office Visit Edward Jolly, MD [facility]  06/27/23 Procedure visit Edward Jolly, MD [facility]  06/21/23 Office Visit Edward Jolly, MD [facility]  Showing recent visits within past 90 days and meeting all other requirements Today's Visits Date Type Provider Dept   09/12/23 Procedure visit Edward Jolly, MD [facility]  Showing today's visits and meeting all other requirements Future Appointments No visits were found meeting these conditions. Showing future appointments within next 90 days and meeting all other requirements  Disposition: Discharge home  Discharge (Date  Time): 09/12/2023; 1420 hrs.   Primary Care Physician: Jerl Mina, MD Location: [facility] Note by: Edward Jolly, MD (TTS technology used. I apologize for any typographical errors that were not detected and corrected.) Date: 09/12/2023; Time: 2:47 PM  Disclaimer:  Medicine is not an Visual merchandiser. The only guarantee in medicine is that nothing is guaranteed. It is important to note that the decision to proceed with this intervention was based on the information collected from the patient. The Data and conclusions were drawn from the patient's questionnaire, the interview, and the physical examination. Because the information was provided in large part by the patient, it cannot be guaranteed that it has not been purposely or unconsciously manipulated. Every effort has been made to obtain as much relevant data as possible for this evaluation. It is important to note that the conclusions that lead to this procedure are derived in large part from the available data. Always take into account that the treatment will also be dependent on availability of resources and existing treatment guidelines, considered by other Pain Management Practitioners as being common knowledge and practice, at the time of the intervention. For Medico-Legal purposes, it is also important to point out that variation in procedural techniques and pharmacological choices are the acceptable norm. The indications, contraindications, technique, and results of the above procedure should only be interpreted and judged by a Board-Certified Interventional Pain Specialist with extensive  familiarity and expertise in the same exact procedure and technique.

## 2023-09-13 ENCOUNTER — Telehealth: Payer: Self-pay

## 2023-09-13 NOTE — Telephone Encounter (Signed)
Post procedure follow up.  Patient states he is doing well 

## 2023-12-01 ENCOUNTER — Ambulatory Visit
Payer: BLUE CROSS/BLUE SHIELD | Attending: Student in an Organized Health Care Education/Training Program | Admitting: Student in an Organized Health Care Education/Training Program

## 2023-12-01 ENCOUNTER — Encounter: Payer: Self-pay | Admitting: Student in an Organized Health Care Education/Training Program

## 2023-12-01 VITALS — BP 121/80 | HR 69 | Temp 98.6°F | Resp 16 | Ht 66.0 in | Wt 172.0 lb

## 2023-12-01 DIAGNOSIS — M4726 Other spondylosis with radiculopathy, lumbar region: Secondary | ICD-10-CM

## 2023-12-01 DIAGNOSIS — G8929 Other chronic pain: Secondary | ICD-10-CM | POA: Diagnosis present

## 2023-12-01 DIAGNOSIS — M546 Pain in thoracic spine: Secondary | ICD-10-CM | POA: Diagnosis present

## 2023-12-01 DIAGNOSIS — M5116 Intervertebral disc disorders with radiculopathy, lumbar region: Secondary | ICD-10-CM | POA: Insufficient documentation

## 2023-12-01 DIAGNOSIS — M5412 Radiculopathy, cervical region: Secondary | ICD-10-CM | POA: Diagnosis present

## 2023-12-01 DIAGNOSIS — G894 Chronic pain syndrome: Secondary | ICD-10-CM | POA: Diagnosis present

## 2023-12-01 DIAGNOSIS — M47816 Spondylosis without myelopathy or radiculopathy, lumbar region: Secondary | ICD-10-CM | POA: Insufficient documentation

## 2023-12-01 MED ORDER — METHOCARBAMOL 750 MG PO TABS
750.0000 mg | ORAL_TABLET | Freq: Two times a day (BID) | ORAL | 1 refills | Status: AC | PRN
Start: 2023-12-01 — End: ?
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
## 2023-12-01 NOTE — Patient Instructions (Signed)

## 2023-12-06 ENCOUNTER — Telehealth: Payer: Self-pay | Admitting: Student in an Organized Health Care Education/Training Program

## 2023-12-06 NOTE — Telephone Encounter (Signed)
PT called stated that at his last visit he spoke with [facility] about sending a referral to Dr. Jerl Mina so patient will be able to get PT on his neck. PT stated that he will be able to do both session at the dame time if the referral is from the same doctor. Please give patient a call. TY

## 2023-12-07 ENCOUNTER — Telehealth: Payer: Self-pay

## 2023-12-07 NOTE — Telephone Encounter (Signed)
 LM for patient to call office

## 2023-12-21 ENCOUNTER — Telehealth: Payer: Self-pay | Admitting: Student in an Organized Health Care Education/Training Program

## 2023-12-21 NOTE — Telephone Encounter (Signed)
 Patient called to find out what he needed to do about getting a FCE and letter stating what his abilities will be. He has a settlement hearing in June. Please ask Dr Cherylann Ratel if he wants to order the FCE and do this or have PCP do this.

## 2023-12-22 NOTE — Telephone Encounter (Signed)
 I informed patient and he is taking care of it.

## 2024-01-02 ENCOUNTER — Encounter: Payer: Self-pay | Admitting: Student in an Organized Health Care Education/Training Program

## 2024-01-02 ENCOUNTER — Ambulatory Visit
Payer: BLUE CROSS/BLUE SHIELD | Attending: Student in an Organized Health Care Education/Training Program | Admitting: Student in an Organized Health Care Education/Training Program

## 2024-01-02 ENCOUNTER — Ambulatory Visit
Admission: RE | Admit: 2024-01-02 | Discharge: 2024-01-02 | Disposition: A | Discharge status: Home / Self Care | Source: Ambulatory Visit | Attending: Student in an Organized Health Care Education/Training Program | Admitting: Student in an Organized Health Care Education/Training Program

## 2024-01-02 VITALS — BP 132/74 | HR 69 | Temp 99.0°F | Resp 18 | Ht 66.0 in | Wt 167.0 lb

## 2024-01-02 DIAGNOSIS — G894 Chronic pain syndrome: Secondary | ICD-10-CM | POA: Insufficient documentation

## 2024-01-02 DIAGNOSIS — M5412 Radiculopathy, cervical region: Secondary | ICD-10-CM | POA: Insufficient documentation

## 2024-01-02 MED ORDER — ROPIVACAINE HCL 2 MG/ML IJ SOLN
1.0000 mL | Freq: Once | INTRAMUSCULAR | Status: AC
Start: 2024-01-02 — End: 2024-01-02
  Administered 2024-01-02: 1 mL via EPIDURAL
  Filled 2024-01-02: qty 20

## 2024-01-02 MED ORDER — SODIUM CHLORIDE 0.9% FLUSH
1.0000 mL | Freq: Once | INTRAVENOUS | Status: AC
  Administered 2024-01-02: 1 mL

## 2024-01-02 MED ORDER — LIDOCAINE HCL 2 % IJ SOLN
20.0000 mL | Freq: Once | INTRAMUSCULAR | Status: AC
  Administered 2024-01-02: 100 mg
  Filled 2024-01-02: qty 40

## 2024-01-02 MED ORDER — DEXAMETHASONE SODIUM PHOSPHATE 10 MG/ML IJ SOLN
10.0000 mg | Freq: Once | INTRAMUSCULAR | Status: AC
  Administered 2024-01-02: 10 mg
  Filled 2024-01-02: qty 1

## 2024-01-02 MED ORDER — IOHEXOL 180 MG/ML  SOLN
10.0000 mL | Freq: Once | INTRAMUSCULAR | Status: AC
  Administered 2024-01-02: 10 mL via EPIDURAL
  Filled 2024-01-02: qty 20

## 2024-01-02 NOTE — Patient Instructions (Signed)

## 2024-01-02 NOTE — Progress Notes (Signed)
 PROVIDER NOTE: Interpretation of information contained herein should be left to medically-trained personnel. Specific patient instructions are provided elsewhere under "Patient Instructions" section of medical record. This document was created in part using STT-dictation technology, any transcriptional errors that may result from this process are unintentional.  Patient: Anthony Guerrero Type: Established DOB: 05-Mar-1961 MRN: [id number] PCP: Jerl Mina, MD  Service: Procedure DOS: 01/02/2024 Setting: Ambulatory Location: Ambulatory outpatient facility Delivery: Face-to-face Provider: Edward Jolly, MD Specialty: Interventional Pain Management Specialty designation: 09 Location: Outpatient facility Ref. Prov.: Jerl Mina, MD       Interventional Therapy   Procedure: Cervical Epidural Steroid injection (CESI) (Interlaminar) #1 for 2025  Laterality: Right  Level: C7-T1 Imaging: Fluoroscopy-assisted DOS: 01/02/2024  Performed by: Edward Jolly, MD Anesthesia: Local anesthesia (1-2% Lidocaine)  Purpose: Diagnostic/Therapeutic Indications: Cervicalgia, cervical radicular pain, degenerative disc disease, severe enough to impact quality of life or function. 1. Cervical radicular pain   2. Chronic pain syndrome    NAS-11 score:   Pre-procedure: 8 /10   Post-procedure: 8 /10      Position  Prep  Materials:  Location setting: Procedure suite Position: Prone, on modified reverse trendelenburg to facilitate breathing, with head in head-cradle. Pillows positioned under chest (below chin-level) with cervical spine flexed. Safety Precautions: Patient was assessed for positional comfort and pressure points before starting the procedure. Prepping solution: DuraPrep (Iodine Povacrylex [0.7% available iodine] and Isopropyl Alcohol, 74% w/w) Prep Area: Entire  cervicothoracic region Approach: percutaneous, paramedial Intended target: Posterior cervical epidural space Materials Procedure:   Tray: Epidural Needle(s): Epidural (Tuohy) Qty: 1 Length: (90mm) 3.5-inch Gauge: 22G   Pre-op H&P Assessment:  [name] is a 63 y.o. (year old), male patient, seen today for interventional treatment. He  has a past surgical history that includes Fracture surgery; Appendectomy; Toe Surgery; Spinal cord stimulator trial; and Pulse generator implant (N/A, 07/05/2023). [name] has a current medication list which includes the following prescription(s): betamethasone dipropionate, celecoxib, gabapentin, latanoprost, lidocaine, and methocarbamol. His primarily concern today is the Neck Pain  Initial Vital Signs:  Pulse/HCG Rate: 69ECG Heart Rate: 90 Temp: 99 F (37.2 C) Resp: 16 BP: 110/67 SpO2: 100 %  BMI: Estimated body mass index is 26.95 kg/m as calculated from the following:   Height as of this encounter: 5\' 6"  (1.676 m).   Weight as of this encounter: 167 lb (75.8 kg).  Risk Assessment: Allergies: Reviewed. He has no known allergies.  Allergy Precautions: None required Coagulopathies: Reviewed. None identified.  Blood-thinner therapy: None at this time Active Infection(s): Reviewed. None identified. [name] is afebrile  Site Confirmation: [name] was asked to confirm the procedure and laterality before marking the site Procedure checklist: Completed Consent: Before the procedure and under the influence of no sedative(s), amnesic(s), or anxiolytics, the patient was informed of the treatment options, risks and possible complications. To fulfill our ethical and legal obligations, as recommended by the American Medical Association's Code of Ethics, I have informed the patient of my clinical impression; the nature and purpose of the treatment or procedure; the risks, benefits, and possible complications of the intervention; the alternatives, including doing nothing; the risk(s) and benefit(s) of the alternative treatment(s) or procedure(s); and the risk(s) and benefit(s)  of doing nothing. The patient was provided information about the general risks and possible complications associated with the procedure. These may include, but are not limited to: failure to achieve desired goals, infection, bleeding, organ or nerve damage, allergic reactions, paralysis, and death. In addition, the  patient was informed of those risks and complications associated to Spine-related procedures, such as failure to decrease pain; infection (i.e.: Meningitis, epidural or intraspinal abscess); bleeding (i.e.: epidural hematoma, subarachnoid hemorrhage, or any other type of intraspinal or peri-dural bleeding); organ or nerve damage (i.e.: Any type of peripheral nerve, nerve root, or spinal cord injury) with subsequent damage to sensory, motor, and/or autonomic systems, resulting in permanent pain, numbness, and/or weakness of one or several areas of the body; allergic reactions; (i.e.: anaphylactic reaction); and/or death. Furthermore, the patient was informed of those risks and complications associated with the medications. These include, but are not limited to: allergic reactions (i.e.: anaphylactic or anaphylactoid reaction(s)); adrenal axis suppression; blood sugar elevation that in diabetics may result in ketoacidosis or comma; water retention that in patients with history of congestive heart failure may result in shortness of breath, pulmonary edema, and decompensation with resultant heart failure; weight gain; swelling or edema; medication-induced neural toxicity; particulate matter embolism and blood vessel occlusion with resultant organ, and/or nervous system infarction; and/or aseptic necrosis of one or more joints. Finally, the patient was informed that Medicine is not an exact science; therefore, there is also the possibility of unforeseen or unpredictable risks and/or possible complications that may result in a catastrophic outcome. The patient indicated having understood very clearly. We  have given the patient no guarantees and we have made no promises. Enough time was given to the patient to ask questions, all of which were answered to the patient's satisfaction. [name] has indicated that he wanted to continue with the procedure. Attestation: I, the ordering provider, attest that I have discussed with the patient the benefits, risks, side-effects, alternatives, likelihood of achieving goals, and potential problems during recovery for the procedure that I have provided informed consent. Date  Time: 01/02/2024  2:00 PM   Pre-Procedure Preparation:  Monitoring: As per clinic protocol. Respiration, ETCO2, SpO2, BP, heart rate and rhythm monitor placed and checked for adequate function Safety Precautions: Patient was assessed for positional comfort and pressure points before starting the procedure. Time-out: I initiated and conducted the "Time-out" before starting the procedure, as per protocol. The patient was asked to participate by confirming the accuracy of the "Time Out" information. Verification of the correct person, site, and procedure were performed and confirmed by me, the nursing staff, and the patient. "Time-out" conducted as per Joint Commission's Universal Protocol (UP.01.01.01). Time: 1429 Start Time: 1429 hrs.  Description  Narrative of Procedure:          Rationale (medical necessity): procedure needed and proper for the diagnosis and/or treatment of the patient's medical symptoms and needs. Start Time: 1429 hrs. Safety Precautions: Aspiration looking for blood return was conducted prior to all injections. At no point did we inject any substances, as a needle was being advanced. No attempts were made at seeking any paresthesias. Safe injection practices and needle disposal techniques used. Medications properly checked for expiration dates. SDV (single dose vial) medications used. Description of procedure: Protocol guidelines were followed. The patient was assisted  into a comfortable position. The target area was identified and the area prepped in the usual manner. Skin & deeper tissues infiltrated with local anesthetic. Appropriate amount of time allowed to pass for local anesthetics to take effect. Using fluoroscopic guidance, the epidural needle was introduced through the skin, ipsilateral to the reported pain, and advanced to the target area. Posterior laminar os was contacted and the needle walked caudad, until the lamina was cleared. The ligamentum flavum  was engaged and the epidural space identified using "loss-of-resistance technique" with 2-3 ml of PF-NaCl (0.9% NSS), in a 5cc dedicated LOR syringe. (See "Imaging guidance" below for use of contrast details.) Once proper needle placement was secured, and negative aspiration confirmed, the solution was injected in intermittent fashion, asking for systemic symptoms every 0.5cc. The needles were then removed and the area cleansed, making sure to leave some of the prepping solution back to take advantage of its long term bactericidal properties.  3 cc solution made of 1 cc of preservative-free saline, 1 cc of 0.2% ropivacaine, 1 cc of Decadron 10 mg/cc.   Vitals:   01/02/24 1409 01/02/24 1428 01/02/24 1433  BP: 110/67 134/74 132/74  Pulse: 69    Resp: 16 18 18   Temp: 99 F (37.2 C)    SpO2: 100% 100% 99%  Weight: 167 lb (75.8 kg)    Height: 5\' 6"  (1.676 m)        End Time: 1432 hrs.  Imaging Guidance (Spinal):          Type of Imaging Technique: Fluoroscopy Guidance (Spinal) Indication(s): Assistance in needle guidance and placement for procedures requiring needle placement in or near specific anatomical locations not easily accessible without such assistance. Exposure Time: Please see nurses notes. Contrast: Before injecting any contrast, we confirmed that the patient did not have an allergy to iodine, shellfish, or radiological contrast. Once satisfactory needle placement was completed at the  desired level, radiological contrast was injected. Contrast injected under live fluoroscopy. No contrast complications. See chart for type and volume of contrast used. Fluoroscopic Guidance: I was personally present during the use of fluoroscopy. "Tunnel Vision Technique" used to obtain the best possible view of the target area. Parallax error corrected before commencing the procedure. "Direction-depth-direction" technique used to introduce the needle under continuous pulsed fluoroscopy. Once target was reached, antero-posterior, oblique, and lateral fluoroscopic projection used confirm needle placement in all planes. Images permanently stored in EMR. Interpretation: I personally interpreted the imaging intraoperatively. Adequate needle placement confirmed in multiple planes. Appropriate spread of contrast into desired area was observed. No evidence of afferent or efferent intravascular uptake. No intrathecal or subarachnoid spread observed. Permanent images saved into the patient's record.  Post-operative Assessment:  Post-procedure Vital Signs:  Pulse/HCG Rate: 6970 Temp: 99 F (37.2 C) Resp: 18 BP: 132/74 SpO2: 99 %  EBL: None  Complications: No immediate post-treatment complications observed by team, or reported by patient.  Note: The patient tolerated the entire procedure well. A repeat set of vitals were taken after the procedure and the patient was kept under observation following institutional policy, for this type of procedure. Post-procedural neurological assessment was performed, showing return to baseline, prior to discharge. The patient was provided with post-procedure discharge instructions, including a section on how to identify potential problems. Should any problems arise concerning this procedure, the patient was given instructions to immediately contact us, at any time, without hesitation. In any case, we plan to contact the patient by telephone for a follow-up status report  regarding this interventional procedure.  Comments:  No additional relevant information.  Plan of Care (POC)  Orders:  Orders Placed This Encounter  Procedures   DG PAIN CLINIC C-ARM 1-60 MIN NO REPORT    Intraoperative interpretation by procedural physician at [facility].    Standing Status:   Standing    Number of Occurrences:   1    Reason for exam::   Assistance in needle guidance and placement for  procedures requiring needle placement in or near specific anatomical locations not easily accessible without such assistance.     Medications ordered for procedure: Meds ordered this encounter  Medications   iohexol (OMNIPAQUE) 180 MG/ML injection 10 mL    Must be Myelogram-compatible. If not available, you may substitute with a water-soluble, non-ionic, hypoallergenic, myelogram-compatible radiological contrast medium.   lidocaine (XYLOCAINE) 2 % (with pres) injection 400 mg   sodium chloride flush (NS) 0.9 % injection 1 mL   ropivacaine (PF) 2 mg/mL (0.2%) (NAROPIN) injection 1 mL   dexamethasone (DECADRON) injection 10 mg   Medications administered: We administered iohexol, lidocaine, sodium chloride flush, ropivacaine (PF) 2 mg/mL (0.2%), and dexamethasone.  See the medical record for exact dosing, route, and time of administration.  Follow-up plan:   Return in about 4 weeks (around 01/30/2024) for VV PPE.       Right L5-S1 ESI, Right C7-T1 ESI 04/06/23, 06/27/23, 09/12/23, 01/02/24      Recent Visits Date Type Provider Dept  12/01/23 Office Visit Edward Jolly, MD [facility]  Showing recent visits within past 90 days and meeting all other requirements Today's Visits Date Type Provider Dept  01/02/24 Procedure visit Edward Jolly, MD [facility]  Showing today's visits and meeting all other requirements Future Appointments Date Type Provider Dept  01/30/24 Appointment Edward Jolly, MD [facility]  Showing future appointments  within next 90 days and meeting all other requirements  Disposition: Discharge home  Discharge (Date  Time): 01/02/2024; 1445 hrs.   Primary Care Physician: Jerl Mina, MD Location: [facility] Note by: Edward Jolly, MD (TTS technology used. I apologize for any typographical errors that were not detected and corrected.) Date: 01/02/2024; Time: 4:02 PM  Disclaimer:  Medicine is not an Visual merchandiser. The only guarantee in medicine is that nothing is guaranteed. It is important to note that the decision to proceed with this intervention was based on the information collected from the patient. The Data and conclusions were drawn from the patient's questionnaire, the interview, and the physical examination. Because the information was provided in large part by the patient, it cannot be guaranteed that it has not been purposely or unconsciously manipulated. Every effort has been made to obtain as much relevant data as possible for this evaluation. It is important to note that the conclusions that lead to this procedure are derived in large part from the available data. Always take into account that the treatment will also be dependent on availability of resources and existing treatment guidelines, considered by other Pain Management Practitioners as being common knowledge and practice, at the time of the intervention. For Medico-Legal purposes, it is also important to point out that variation in procedural techniques and pharmacological choices are the acceptable norm. The indications, contraindications, technique, and results of the above procedure should only be interpreted and judged by a Board-Certified Interventional Pain Specialist with extensive familiarity and expertise in the same exact procedure and technique.

## 2024-01-02 NOTE — Progress Notes (Signed)
 Safety precautions to be maintained throughout the outpatient stay will include: orient to surroundings, keep bed in low position, maintain call bell within reach at all times, provide assistance with transfer out of bed and ambulation.

## 2024-01-03 ENCOUNTER — Telehealth: Payer: Self-pay

## 2024-01-03 NOTE — Telephone Encounter (Signed)
 Post procedure follow up.  LM

## 2024-01-23 ENCOUNTER — Ambulatory Visit: Admitting: Student in an Organized Health Care Education/Training Program

## 2024-01-30 ENCOUNTER — Ambulatory Visit
Attending: Student in an Organized Health Care Education/Training Program | Admitting: Student in an Organized Health Care Education/Training Program

## 2024-01-30 DIAGNOSIS — G894 Chronic pain syndrome: Secondary | ICD-10-CM | POA: Diagnosis not present

## 2024-01-30 DIAGNOSIS — M5412 Radiculopathy, cervical region: Secondary | ICD-10-CM | POA: Diagnosis not present

## 2024-01-30 NOTE — Progress Notes (Signed)
 PROVIDER NOTE: Interpretation of information contained herein should be left to medically-trained personnel. Specific patient instructions are provided elsewhere under "Patient Instructions" section of medical record. This document was created in part using AI and STT-dictation technology, any transcriptional errors that may result from this process are unintentional.  Patient: Anthony Guerrero  Service: E/M   PCP: Anthony Mina, MD  DOB: 17-Jan-1961  DOS: 01/30/2024  Provider: Edward Jolly, MD  MRN: [id number]  Delivery: Virtual Visit  Specialty: Interventional Pain Management  Type: Established Patient  Setting: Ambulatory outpatient facility  Specialty designation: 09  Referring Prov.: Anthony Mina, MD  Location: Remote location       Virtual Encounter - Pain Management PROVIDER NOTE: Information contained herein reflects review and annotations entered in association with encounter. Interpretation of such information and data should be left to medically-trained personnel. Information provided to patient can be located elsewhere in the medical record under "Patient Instructions". Document created using STT-dictation technology, any transcriptional errors that may result from process are unintentional.    Contact & Pharmacy Preferred: [phone] Home: [phone] (home) Mobile: [phone] (mobile) E-mail: [email or web address]  [id number] - [place], [place] - [address] [place] [address] Phone: [phone] Fax: [phone]   Pre-screening  Anthony Guerrero offered "in-person" vs "virtual" encounter. He indicated preferring virtual for this encounter.   Reason COVID-19*  Social distancing based on CDC and AMA recommendations.   I contacted Anthony Guerrero on 01/30/2024 via telephone.      I clearly identified myself as Anthony Jolly, MD. I verified that I was speaking with the correct person using two identifiers (Name: Anthony Guerrero, and date of birth:  [birth year]).  Consent I sought verbal advanced consent from Anthony Guerrero for virtual visit interactions. I informed Anthony Guerrero of possible security and privacy concerns, risks, and limitations associated with providing "not-in-person" medical evaluation and management services. I also informed Anthony Guerrero of the availability of "in-person" appointments. Finally, I informed him that there would be a charge for the virtual visit and that he could be  personally, fully or partially, financially responsible for it. Anthony Guerrero expressed understanding and agreed to proceed.   Historic Elements   Mr. Anthony Guerrero is a 63 y.o. year old, male patient evaluated today after our last contact on 01/02/2024. Mr. Anthony Guerrero  has a past medical history of Bulging lumbar disc and Sciatica. He also  has a past surgical history that includes Fracture surgery; Appendectomy; Toe Surgery; Spinal cord stimulator trial; and Pulse generator implant (N/A, 07/05/2023). Mr. Anthony Guerrero has a current medication list which includes the following prescription(s): betamethasone dipropionate, celecoxib, gabapentin, latanoprost, lidocaine, and methocarbamol. He  reports that he has quit smoking. His smoking use included cigarettes. He has never used smokeless tobacco. He reports that he does not drink alcohol and does not use drugs. Mr. Anthony Guerrero has no known allergies.  BMI: Estimated body mass index is 26.95 kg/m as calculated from the following:   Height as of 01/02/24: 5\' 6"  (1.676 m).   Weight as of 01/02/24: 167 lb (75.8 kg). Last encounter: 12/01/2023. Last procedure: 01/02/2024.  HPI  Today, he is being contacted for a post-procedure assessment.   Post-procedure evaluation    Procedure: Cervical Epidural Steroid injection (CESI) (Interlaminar) #1 for 2025  Laterality: Right  Level: C7-T1 Imaging: Fluoroscopy-assisted DOS: 01/02/2024  Performed by: Anthony Jolly, MD Anesthesia: Local anesthesia (1-2%  Lidocaine)  Purpose: Diagnostic/Therapeutic Indications: Cervicalgia, cervical radicular pain, degenerative disc disease,  severe enough to impact quality of life or function. 1. Cervical radicular pain   2. Chronic pain syndrome    NAS-11 score:   Pre-procedure: 8 /10   Post-procedure: 8 /10    Effectiveness:  Initial hour after procedure: 100 %  Subsequent 4-6 hours post-procedure: 100 %  Analgesia past initial 6 hours: 50 %  Ongoing improvement:  Analgesic:  50% Function: Somewhat improved ROM: Somewhat improved   Assessment  The primary encounter diagnosis was Cervical radicular pain. A diagnosis of Chronic pain syndrome was also pertinent to this visit.  Plan of Care  Patient getting approximately 50% pain relief from his previous cervical epidural steroid injection.  We will continue to monitor his symptoms he is encouraged to contact our clinic if he would like to repeat in the future.   Follow-up plan:   Return for patient will call to schedule F2F appt prn.      Right L5-S1 ESI, Right C7-T1 ESI 04/06/23, 06/27/23, 09/12/23, 01/02/24    Recent Visits Date Type Provider Dept  01/02/24 Procedure visit Cephus Collin, MD [facility]  12/01/23 Office Visit Cephus Collin, MD [facility]  Showing recent visits within past 90 days and meeting all other requirements Today's Visits Date Type Provider Dept  01/30/24 Office Visit Cephus Collin, MD [facility]  Showing today's visits and meeting all other requirements Future Appointments No visits were found meeting these conditions. Showing future appointments within next 90 days and meeting all other requirements  I discussed the assessment and treatment plan with the patient. The patient was provided an opportunity to ask questions and all were answered. The patient agreed with the plan and demonstrated an understanding of the instructions.  Patient advised to call back or seek an in-person  evaluation if the symptoms or condition worsens.  Duration of encounter: 10 minutes.  Note by: Cephus Collin, MD Date: 01/30/2024; Time: 4:45 PM

## 2024-02-14 ENCOUNTER — Ambulatory Visit
Attending: Student in an Organized Health Care Education/Training Program | Admitting: Student in an Organized Health Care Education/Training Program

## 2024-02-14 ENCOUNTER — Telehealth: Payer: Self-pay | Admitting: Neurosurgery

## 2024-02-14 ENCOUNTER — Encounter: Payer: Self-pay | Admitting: Student in an Organized Health Care Education/Training Program

## 2024-02-14 VITALS — BP 126/74 | HR 65 | Temp 97.2°F | Resp 16 | Ht 66.0 in | Wt 167.0 lb

## 2024-02-14 DIAGNOSIS — M546 Pain in thoracic spine: Secondary | ICD-10-CM | POA: Diagnosis not present

## 2024-02-14 DIAGNOSIS — G8929 Other chronic pain: Secondary | ICD-10-CM | POA: Insufficient documentation

## 2024-02-14 DIAGNOSIS — M5412 Radiculopathy, cervical region: Secondary | ICD-10-CM | POA: Insufficient documentation

## 2024-02-14 DIAGNOSIS — M47816 Spondylosis without myelopathy or radiculopathy, lumbar region: Secondary | ICD-10-CM | POA: Diagnosis not present
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
## 2024-02-14 NOTE — Telephone Encounter (Signed)
 He is currently a patient of Dr.Lateef for neck and back pain. He is getting injections in his neck every 3 months and he has a lumbar SCS. He wants to schedule a 2nd opinion with Dr.Yarbrough to make sure that he is getting the proper treatment from the pain clinic. He is scheduled for his next injection on 04/04/2024. He last seen Dr[facility]Yarbrough at [facility] in 2023. He has not recent imaging. Should he start with a PA?

## 2024-02-14 NOTE — Patient Instructions (Signed)
 GENERAL RISKS AND COMPLICATIONS  What are the risk, side effects and possible complications? Generally speaking, most procedures are safe.  However, with any procedure there are risks, side effects, and the possibility of complications.  The risks and complications are dependent upon the sites that are lesioned, or the type of nerve block to be performed.  The closer the procedure is to the spine, the more serious the risks are.  Great care is taken when placing the radio frequency needles, block needles or lesioning probes, but sometimes complications can occur. Infection: Any time there is an injection through the skin, there is a risk of infection.  This is why sterile conditions are used for these blocks.  There are four possible types of infection. Localized skin infection. Central Nervous System Infection-This can be in the form of Meningitis, which can be deadly. Epidural Infections-This can be in the form of an epidural abscess, which can cause pressure inside of the spine, causing compression of the spinal cord with subsequent paralysis. This would require an emergency surgery to decompress, and there are no guarantees that the patient would recover from the paralysis. Discitis-This is an infection of the intervertebral discs.  It occurs in about 1% of discography procedures.  It is difficult to treat and it may lead to surgery.        2. Pain: the needles have to go through skin and soft tissues, will cause soreness.       3. Damage to internal structures:  The nerves to be lesioned may be near blood vessels or    other nerves which can be potentially damaged.       4. Bleeding: Bleeding is more common if the patient is taking blood thinners such as  aspirin, Coumadin, Ticiid, Plavix, etc., or if he/she have some genetic predisposition  such as hemophilia. Bleeding into the spinal canal can cause compression of the spinal  cord with subsequent paralysis.  This would require an emergency  surgery to  decompress and there are no guarantees that the patient would recover from the  paralysis.       5. Pneumothorax:  Puncturing of a lung is a possibility, every time a needle is introduced in  the area of the chest or upper back.  Pneumothorax refers to free air around the  collapsed lung(s), inside of the thoracic cavity (chest cavity).  Another two possible  complications related to a similar event would include: Hemothorax and Chylothorax.   These are variations of the Pneumothorax, where instead of air around the collapsed  lung(s), you may have blood or chyle, respectively.       6. Spinal headaches: They may occur with any procedures in the area of the spine.       7. Persistent CSF (Cerebro-Spinal Fluid) leakage: This is a rare problem, but may occur  with prolonged intrathecal or epidural catheters either due to the formation of a fistulous  track or a dural tear.       8. Nerve damage: By working so close to the spinal cord, there is always a possibility of  nerve damage, which could be as serious as a permanent spinal cord injury with  paralysis.       9. Death:  Although rare, severe deadly allergic reactions known as "Anaphylactic  reaction" can occur to any of the medications used.      10. Worsening of the symptoms:  We can always make thing worse.  What are the chances  of something like this happening? Chances of any of this occuring are extremely low.  By statistics, you have more of a chance of getting killed in a motor vehicle accident: while driving to the hospital than any of the above occurring .  Nevertheless, you should be aware that they are possibilities.  In general, it is similar to taking a shower.  Everybody knows that you can slip, hit your head and get killed.  Does that mean that you should not shower again?  Nevertheless always keep in mind that statistics do not mean anything if you happen to be on the wrong side of them.  Even if a procedure has a 1 (one) in a  1,000,000 (million) chance of going wrong, it you happen to be that one..Also, keep in mind that by statistics, you have more of a chance of having something go wrong when taking medications.  Who should not have this procedure? If you are on a blood thinning medication (e.g. Coumadin, Plavix, see list of "Blood Thinners"), or if you have an active infection going on, you should not have the procedure.  If you are taking any blood thinners, please inform your physician.  How should I prepare for this procedure? Do not eat or drink anything at least six hours prior to the procedure. Bring a driver with you .  It cannot be a taxi. Come accompanied by an adult that can drive you back, and that is strong enough to help you if your legs get weak or numb from the local anesthetic. Take all of your medicines the morning of the procedure with just enough water to swallow them. If you have diabetes, make sure that you are scheduled to have your procedure done first thing in the morning, whenever possible. If you have diabetes, take only half of your insulin dose and notify our nurse that you have done so as soon as you arrive at the clinic. If you are diabetic, but only take blood sugar pills (oral hypoglycemic), then do not take them on the morning of your procedure.  You may take them after you have had the procedure. Do not take aspirin or any aspirin-containing medications, at least eleven (11) days prior to the procedure.  They may prolong bleeding. Wear loose fitting clothing that may be easy to take off and that you would not mind if it got stained with Betadine or blood. Do not wear any jewelry or perfume Remove any nail coloring.  It will interfere with some of our monitoring equipment.  NOTE: Remember that this is not meant to be interpreted as a complete list of all possible complications.  Unforeseen problems may occur.  BLOOD THINNERS The following drugs contain aspirin or other products,  which can cause increased bleeding during surgery and should not be taken for 2 weeks prior to and 1 week after surgery.  If you should need take something for relief of minor pain, you may take acetaminophen which is found in Tylenol,m Datril, Anacin-3 and Panadol. It is not blood thinner. The products listed below are.  Do not take any of the products listed below in addition to any listed on your instruction sheet.  A.P.C or A.P.C with Codeine Codeine Phosphate Capsules #3 Ibuprofen Ridaura  ABC compound Congesprin Imuran rimadil  Advil Cope Indocin Robaxisal  Alka-Seltzer Effervescent Pain Reliever and Antacid Coricidin or Coricidin-D  Indomethacin Rufen  Alka-Seltzer plus Cold Medicine Cosprin Ketoprofen S-A-C Tablets  Anacin Analgesic Tablets or Capsules Coumadin  Korlgesic Salflex  Anacin Extra Strength Analgesic tablets or capsules CP-2 Tablets Lanoril Salicylate  Anaprox Cuprimine Capsules Levenox Salocol  Anexsia-D Dalteparin Magan Salsalate  Anodynos Darvon compound Magnesium Salicylate Sine-off  Ansaid Dasin Capsules Magsal Sodium Salicylate  Anturane Depen Capsules Marnal Soma  APF Arthritis pain formula Dewitt's Pills Measurin Stanback  Argesic Dia-Gesic Meclofenamic Sulfinpyrazone  Arthritis Bayer Timed Release Aspirin Diclofenac Meclomen Sulindac  Arthritis pain formula Anacin Dicumarol Medipren Supac  Analgesic (Safety coated) Arthralgen Diffunasal Mefanamic Suprofen  Arthritis Strength Bufferin Dihydrocodeine Mepro Compound Suprol  Arthropan liquid Dopirydamole Methcarbomol with Aspirin Synalgos  ASA tablets/Enseals Disalcid Micrainin Tagament  Ascriptin Doan's Midol Talwin  Ascriptin A/D Dolene Mobidin Tanderil  Ascriptin Extra Strength Dolobid Moblgesic Ticlid  Ascriptin with Codeine Doloprin or Doloprin with Codeine Momentum Tolectin  Asperbuf Duoprin Mono-gesic Trendar  Aspergum Duradyne Motrin or Motrin IB Triminicin  Aspirin plain, buffered or enteric coated  Durasal Myochrisine Trigesic  Aspirin Suppositories Easprin Nalfon Trillsate  Aspirin with Codeine Ecotrin Regular or Extra Strength Naprosyn Uracel  Atromid-S Efficin Naproxen Ursinus  Auranofin Capsules Elmiron Neocylate Vanquish  Axotal Emagrin Norgesic Verin  Azathioprine Empirin or Empirin with Codeine Normiflo Vitamin E  Azolid Emprazil Nuprin Voltaren  Bayer Aspirin plain, buffered or children's or timed BC Tablets or powders Encaprin Orgaran Warfarin Sodium  Buff-a-Comp Enoxaparin Orudis Zorpin  Buff-a-Comp with Codeine Equegesic Os-Cal-Gesic   Buffaprin Excedrin plain, buffered or Extra Strength Oxalid   Bufferin Arthritis Strength Feldene Oxphenbutazone   Bufferin plain or Extra Strength Feldene Capsules Oxycodone with Aspirin   Bufferin with Codeine Fenoprofen Fenoprofen Pabalate or Pabalate-SF   Buffets II Flogesic Panagesic   Buffinol plain or Extra Strength Florinal or Florinal with Codeine Panwarfarin   Buf-Tabs Flurbiprofen Penicillamine   Butalbital Compound Four-way cold tablets Penicillin   Butazolidin Fragmin Pepto-Bismol   Carbenicillin Geminisyn Percodan   Carna Arthritis Reliever Geopen Persantine   Carprofen Gold's salt Persistin   Chloramphenicol Goody's Phenylbutazone   Chloromycetin Haltrain Piroxlcam   Clmetidine heparin Plaquenil   Cllnoril Hyco-pap Ponstel   Clofibrate Hydroxy chloroquine Propoxyphen         Before stopping any of these medications, be sure to consult the physician who ordered them.  Some, such as Coumadin (Warfarin) are ordered to prevent or treat serious conditions such as "deep thrombosis", "pumonary embolisms", and other heart problems.  The amount of time that you may need off of the medication may also vary with the medication and the reason for which you were taking it.  If you are taking any of these medications, please make sure you notify your pain physician before you undergo any procedures.         Epidural Steroid  Injection Patient Information  Description: The epidural space surrounds the nerves as they exit the spinal cord.  In some patients, the nerves can be compressed and inflamed by a bulging disc or a tight spinal canal (spinal stenosis).  By injecting steroids into the epidural space, we can bring irritated nerves into direct contact with a potentially helpful medication.  These steroids act directly on the irritated nerves and can reduce swelling and inflammation which often leads to decreased pain.  Epidural steroids may be injected anywhere along the spine and from the neck to the low back depending upon the location of your pain.   After numbing the skin with local anesthetic (like Novocaine), a small needle is passed into the epidural space slowly.  You may experience a sensation of pressure while this  is being done.  The entire block usually last less than 10 minutes.  Conditions which may be treated by epidural steroids:  Low back and leg pain Neck and arm pain Spinal stenosis Post-laminectomy syndrome Herpes zoster (shingles) pain Pain from compression fractures  Preparation for the injection:  Do not eat any solid food or dairy products within 8 hours of your appointment.  You may drink clear liquids up to 3 hours before appointment.  Clear liquids include water, black coffee, juice or soda.  No milk or cream please. You may take your regular medication, including pain medications, with a sip of water before your appointment  Diabetics should hold regular insulin (if taken separately) and take 1/2 normal NPH dos the morning of the procedure.  Carry some sugar containing items with you to your appointment. A driver must accompany you and be prepared to drive you home after your procedure.  Bring all your current medications with your. An IV may be inserted and sedation may be given at the discretion of the physician.   A blood pressure cuff, EKG and other monitors will often be applied  during the procedure.  Some patients may need to have extra oxygen administered for a short period. You will be asked to provide medical information, including your allergies, prior to the procedure.  We must know immediately if you are taking blood thinners (like Coumadin/Warfarin)  Or if you are allergic to IV iodine contrast (dye). We must know if you could possible be pregnant.  Possible side-effects: Bleeding from needle site Infection (rare, may require surgery) Nerve injury (rare) Numbness & tingling (temporary) Difficulty urinating (rare, temporary) Spinal headache ( a headache worse with upright posture) Light -headedness (temporary) Pain at injection site (several days) Decreased blood pressure (temporary) Weakness in arm/leg (temporary) Pressure sensation in back/neck (temporary)  Call if you experience: Fever/chills associated with headache or increased back/neck pain. Headache worsened by an upright position. New onset weakness or numbness of an extremity below the injection site Hives or difficulty breathing (go to the emergency room) Inflammation or drainage at the infection site Severe back/neck pain Any new symptoms which are concerning to you  Please note:  Although the local anesthetic injected can often make your back or neck feel good for several hours after the injection, the pain will likely return.  It takes 3-7 days for steroids to work in the epidural space.  You may not notice any pain relief for at least that one week.  If effective, we will often do a series of three injections spaced 3-6 weeks apart to maximally decrease your pain.  After the initial series, we generally will wait several months before considering a repeat injection of the same type.  If you have any questions, please call [phone] [facility]

## 2024-02-15 ENCOUNTER — Ambulatory Visit: Admitting: Pain Medicine

## 2024-02-15 NOTE — Telephone Encounter (Signed)
 He confirmed appt for 03/08/2024.

## 2024-02-23 ENCOUNTER — Encounter: Payer: Self-pay | Admitting: Gastroenterology

## 2024-02-24 ENCOUNTER — Encounter (HOSPITAL_COMMUNITY): Payer: Self-pay

## 2024-03-01 ENCOUNTER — Encounter: Payer: Self-pay | Admitting: Gastroenterology

## 2024-03-02 ENCOUNTER — Ambulatory Visit
Admission: RE | Admit: 2024-03-02 | Discharge: 2024-03-02 | Disposition: A | Discharge status: Home / Self Care | Attending: Gastroenterology | Admitting: Gastroenterology

## 2024-03-02 ENCOUNTER — Ambulatory Visit: Payer: Self-pay | Admitting: Anesthesiology

## 2024-03-02 ENCOUNTER — Encounter: Payer: Self-pay | Admitting: Gastroenterology

## 2024-03-02 ENCOUNTER — Encounter
Admission: RE | Disposition: A | Discharge status: Home / Self Care | Payer: Self-pay | Source: Home / Self Care | Attending: Gastroenterology

## 2024-03-02 DIAGNOSIS — K635 Polyp of colon: Secondary | ICD-10-CM | POA: Insufficient documentation

## 2024-03-02 DIAGNOSIS — D123 Benign neoplasm of transverse colon: Secondary | ICD-10-CM | POA: Insufficient documentation

## 2024-03-02 DIAGNOSIS — Z9049 Acquired absence of other specified parts of digestive tract: Secondary | ICD-10-CM | POA: Diagnosis not present

## 2024-03-02 DIAGNOSIS — Z1211 Encounter for screening for malignant neoplasm of colon: Secondary | ICD-10-CM | POA: Diagnosis not present

## 2024-03-02 DIAGNOSIS — Z9682 Presence of neurostimulator: Secondary | ICD-10-CM | POA: Insufficient documentation

## 2024-03-02 DIAGNOSIS — Z860101 Personal history of adenomatous and serrated colon polyps: Secondary | ICD-10-CM | POA: Diagnosis present

## 2024-03-02 DIAGNOSIS — K621 Rectal polyp: Secondary | ICD-10-CM | POA: Diagnosis not present

## 2024-03-02 DIAGNOSIS — D120 Benign neoplasm of cecum: Secondary | ICD-10-CM | POA: Diagnosis not present

## 2024-03-02 DIAGNOSIS — Z87891 Personal history of nicotine dependence: Secondary | ICD-10-CM | POA: Diagnosis not present

## 2024-03-02 HISTORY — PX: COLONOSCOPY: SHX5424

## 2024-03-02 HISTORY — PX: POLYPECTOMY: SHX149

## 2024-03-02 SURGERY — COLONOSCOPY
Anesthesia: General

## 2024-03-02 MED ORDER — PROPOFOL 10 MG/ML IV BOLUS
INTRAVENOUS | Status: DC | PRN
  Administered 2024-03-02: 140 ug/kg/min via INTRAVENOUS
  Administered 2024-03-02: 50 mg via INTRAVENOUS

## 2024-03-02 MED ORDER — LIDOCAINE HCL (CARDIAC) PF 100 MG/5ML IV SOSY
PREFILLED_SYRINGE | INTRAVENOUS | Status: DC | PRN
  Administered 2024-03-02: 100 mg via INTRAVENOUS

## 2024-03-02 MED ORDER — SODIUM CHLORIDE 0.9 % IV SOLN
INTRAVENOUS | Status: DC
  Administered 2024-03-02: 20 mL/h via INTRAVENOUS

## 2024-03-02 MED ORDER — EPHEDRINE SULFATE-NACL 50-0.9 MG/10ML-% IV SOSY
PREFILLED_SYRINGE | INTRAVENOUS | Status: DC | PRN
  Administered 2024-03-02: 5 mg via INTRAVENOUS

## 2024-03-02 MED ORDER — LIDOCAINE HCL (PF) 2 % IJ SOLN
INTRAMUSCULAR | Status: AC
  Filled 2024-03-02: qty 5

## 2024-03-02 NOTE — Transfer of Care (Signed)
 Immediate Anesthesia Transfer of Care Note  Patient: Anthony Guerrero  Procedure(s) Performed: COLONOSCOPY POLYPECTOMY, INTESTINE  Patient Location: PACU  Anesthesia Type:MAC  Level of Consciousness: sedated  Airway & Oxygen Therapy: Patient Spontanous Breathing  Post-op Assessment: Report given to RN and Post -op Vital signs reviewed and stable  Post vital signs: stable  Last Vitals:  Vitals Value Taken Time  BP 88/64 03/02/24 1132  Temp    Pulse 87 03/02/24 1132  Resp 20 03/02/24 1132  SpO2      Last Pain:  Vitals:   03/02/24 1132  TempSrc:   PainSc: Asleep         Complications: No notable events documented.

## 2024-03-02 NOTE — Interval H&P Note (Signed)
 History and Physical Interval Note: Preprocedure H&P from 03/02/24  was reviewed and there was no interval change after seeing and examining the patient.  Written consent was obtained from the patient after discussion of risks, benefits, and alternatives. Patient has consented to proceed with Colonoscopy with possible intervention   03/02/2024 10:51 AM  Anthony Guerrero  has presented today for surgery, with the diagnosis of History of colon polyps (Z86.0100).  The various methods of treatment have been discussed with the patient and family. After consideration of risks, benefits and other options for treatment, the patient has consented to  Procedure(s): COLONOSCOPY (N/A) as a surgical intervention.  The patient's history has been reviewed, patient examined, no change in status, stable for surgery.  I have reviewed the patient's chart and labs.  Questions were answered to the patient's satisfaction.     Quintin Buckle

## 2024-03-02 NOTE — Anesthesia Preprocedure Evaluation (Signed)
 Anesthesia Evaluation  Patient identified by MRN, date of birth, ID band Patient awake    Reviewed: Allergy & Precautions, NPO status , Patient's Chart, lab work & pertinent test results  Airway Mallampati: II  TM Distance: >3 FB Neck ROM: full    Dental  (+) Missing, Edentulous Upper   Pulmonary neg pulmonary ROS, Patient abstained from smoking., former smoker   Pulmonary exam normal breath sounds clear to auscultation       Cardiovascular Exercise Tolerance: Good negative cardio ROS Normal cardiovascular exam Rhythm:Regular Rate:Normal     Neuro/Psych negative neurological ROS  negative psych ROS   GI/Hepatic negative GI ROS, Neg liver ROS,,,  Endo/Other  negative endocrine ROS    Renal/GU negative Renal ROS  negative genitourinary   Musculoskeletal   Abdominal   Peds negative pediatric ROS (+)  Hematology negative hematology ROS (+)   Anesthesia Other Findings Past Medical History: No date: Bulging lumbar disc No date: Sciatica  Past Surgical History: No date: APPENDECTOMY No date: FRACTURE SURGERY 07/05/2023: PULSE GENERATOR IMPLANT; N/A     Comment:  Procedure: SPINAL CORD STIMULATOR PERMANANT  IMPLANT;                Surgeon: Renaldo Caroli, MD;  Location: [facility] ORS;                Service: Neurosurgery;  Laterality: N/A; No date: SPINAL CORD STIMULATOR TRIAL No date: TOE SURGERY     Comment:  fractures  BMI    Body Mass Index: 26.05 kg/m      Reproductive/Obstetrics negative OB ROS                             Anesthesia Physical Anesthesia Plan  ASA: 2  Anesthesia Plan: General   Post-op Pain Management:    Induction: Intravenous  PONV Risk Score and Plan: Propofol  infusion and TIVA  Airway Management Planned: Natural Airway and Nasal Cannula  Additional Equipment:   Intra-op Plan:   Post-operative Plan:   Informed Consent: I have reviewed the  patients History and Physical, chart, labs and discussed the procedure including the risks, benefits and alternatives for the proposed anesthesia with the patient or authorized representative who has indicated his/her understanding and acceptance.     Dental Advisory Given  Plan Discussed with: CRNA  Anesthesia Plan Comments:        Anesthesia Quick Evaluation

## 2024-03-02 NOTE — H&P (Addendum)
 Pre-Procedure H&P   Patient ID: Anthony Guerrero is a 63 y.o. male.  Gastroenterology Provider: Quintin Buckle, DO  Referring Provider: Dr. Dean Every PCP: Lyle San, MD  Date: 03/02/2024  HPI Mr. Anthony Guerrero is a 63 y.o. male who presents today for Colonoscopy for Personal history of colon polyps .  No family history of colon cancer or colon polyps.  Patient reports every other day bowel movement without melena or hematochezia  Spinal stimulator in place on right side S/p appendectomy  Last colonoscopy November 2019 with 1 sessile serrated polyp and hyperplastic polyps.  Internal hemorrhoids also appreciated  Hemoglobin 14.3 MCV 89 platelets 178K creatinine 1.0   Past Medical History:  Diagnosis Date   Bulging lumbar disc    Sciatica     Past Surgical History:  Procedure Laterality Date   APPENDECTOMY     FRACTURE SURGERY     PULSE GENERATOR IMPLANT N/A 07/05/2023   Procedure: SPINAL CORD STIMULATOR PERMANANT  IMPLANT;  Surgeon: Renaldo Caroli, MD;  Location: [facility] ORS;  Service: Neurosurgery;  Laterality: N/A;   SPINAL CORD STIMULATOR TRIAL     TOE SURGERY     fractures    Family History No h/o GI disease or malignancy  Review of Systems  Constitutional:  Negative for activity change, appetite change, chills, diaphoresis, fatigue, fever and unexpected weight change.  HENT:  Negative for trouble swallowing and voice change.   Respiratory:  Negative for shortness of breath and wheezing.   Cardiovascular:  Negative for chest pain, palpitations and leg swelling.  Gastrointestinal:  Negative for abdominal distention, abdominal pain, anal bleeding, blood in stool, constipation, diarrhea, nausea and vomiting.  Skin:  Negative for color change and pallor.  Neurological:  Negative for dizziness, syncope and weakness.  Psychiatric/Behavioral:  Negative for confusion. The patient is not nervous/anxious.   All other systems reviewed and are negative.     Medications No current facility-administered medications on file prior to encounter.   Current Outpatient Medications on File Prior to Encounter  Medication Sig Dispense Refill   betamethasone dipropionate 0.05 % cream Apply topically.     celecoxib (CELEBREX) 100 MG capsule Take 100 mg by mouth 2 (two) times daily.     gabapentin (NEURONTIN) 100 MG capsule Take 100 mg by mouth 3 (three) times daily. 100mg  in the morning and noon, 200mg  at night     latanoprost (XALATAN) 0.005 % ophthalmic solution Place 1 drop into both eyes at bedtime.     lidocaine  (LIDODERM ) 5 % Place 1 patch onto the skin daily. Remove & Discard patch within 12 hours or as directed by MD     methocarbamol  (ROBAXIN ) 750 MG tablet Take 1 tablet (750 mg total) by mouth every 12 (twelve) hours as needed for muscle spasms. 60 tablet 1    Pertinent medications related to GI and procedure were reviewed by me with the patient prior to the procedure   Current Facility-Administered Medications:    0.9 %  sodium chloride  infusion, , Intravenous, Continuous, Quintin Buckle, DO, Last Rate: 20 mL/hr at 03/02/24 1025, 20 mL/hr at 03/02/24 1025  sodium chloride  20 mL/hr (03/02/24 1025)       No Known Allergies Allergies were reviewed by me prior to the procedure  Objective   Body mass index is 26.05 kg/m. Vitals:   03/02/24 1014  BP: 121/77  Pulse: 76  Resp: 18  Temp: (!) 96.8 F (36 C)  TempSrc: Temporal  SpO2: 98%  Weight:  73.2 kg  Height: 5\' 6"  (1.676 m)     Physical Exam Vitals and nursing note reviewed.  Constitutional:      General: He is not in acute distress.    Appearance: Normal appearance. He is not ill-appearing, toxic-appearing or diaphoretic.  HENT:     Head: Normocephalic and atraumatic.     Nose: Nose normal.     Mouth/Throat:     Mouth: Mucous membranes are moist.     Pharynx: Oropharynx is clear.  Eyes:     General: No scleral icterus.    Extraocular Movements: Extraocular  movements intact.  Cardiovascular:     Rate and Rhythm: Normal rate and regular rhythm.     Heart sounds: Normal heart sounds. No murmur heard.    No friction rub. No gallop.  Pulmonary:     Effort: Pulmonary effort is normal. No respiratory distress.     Breath sounds: Normal breath sounds. No wheezing, rhonchi or rales.  Abdominal:     General: Bowel sounds are normal. There is no distension.     Palpations: Abdomen is soft.     Tenderness: There is no abdominal tenderness. There is no guarding or rebound.  Musculoskeletal:     Cervical back: Neck supple.     Right lower leg: No edema.     Left lower leg: No edema.  Skin:    General: Skin is warm and dry.     Coloration: Skin is not jaundiced or pale.  Neurological:     General: No focal deficit present.     Mental Status: He is alert and oriented to person, place, and time. Mental status is at baseline.  Psychiatric:        Mood and Affect: Mood normal.        Behavior: Behavior normal.        Thought Content: Thought content normal.        Judgment: Judgment normal.      Assessment:  Mr. Anthony Guerrero is a 63 y.o. male  who presents today for Colonoscopy for Personal history of colon polyps .  Plan:  Colonoscopy with possible intervention today  Colonoscopy with possible biopsy, control of bleeding, polypectomy, and interventions as necessary has been discussed with the patient/patient representative. Informed consent was obtained from the patient/patient representative after explaining the indication, nature, and risks of the procedure including but not limited to death, bleeding, perforation, missed neoplasm/lesions, cardiorespiratory compromise, and reaction to medications. Opportunity for questions was given and appropriate answers were provided. Patient/patient representative has verbalized understanding is amenable to undergoing the procedure.   Quintin Buckle, DO  [facility]  Gastroenterology  Portions of the record may have been created with voice recognition software. Occasional wrong-word or 'sound-a-like' substitutions may have occurred due to the inherent limitations of voice recognition software.  Read the chart carefully and recognize, using context, where substitutions may have occurred.

## 2024-03-02 NOTE — Op Note (Signed)
 [facility] Gastroenterology Patient Name: Anthony Guerrero Procedure Date: 03/02/2024 10:48 AM MRN: [id number] Account #: [id number] Date of Birth: September 02, 1961 Admit Type: Outpatient Age: 64 Room: [facility] ENDO ROOM 1 Gender: Male Note Status: Finalized Instrument Name: Colonscope [id number] Procedure:             Colonoscopy Indications:           High risk colon cancer surveillance: Personal history                         of colonic polyps Providers:             Bridgett Camps, DO Referring MD:          Doug Gehrig. Dean Every, MD (Referring MD) Medicines:             Monitored Anesthesia Care Complications:         No immediate complications. Estimated blood loss:                         Minimal. Procedure:             Pre-Anesthesia Assessment:                        - Prior to the procedure, a History and Physical was                         performed, and patient medications and allergies were                         reviewed. The patient is competent. The risks and                         benefits of the procedure and the sedation options and                         risks were discussed with the patient. All questions                         were answered and informed consent was obtained.                         Patient identification and proposed procedure were                         verified by the physician, the nurse, the anesthetist                         and the technician in the endoscopy suite. Mental                         Status Examination: alert and oriented. Airway                         Examination: normal oropharyngeal airway and neck                         mobility. Respiratory Examination: clear to  auscultation. CV Examination: RRR, no murmurs, no S3                         or S4. Prophylactic Antibiotics: The patient does not                         require prophylactic antibiotics. Prior                          Anticoagulants: The patient has taken no anticoagulant                         or antiplatelet agents. ASA Grade Assessment: II - A                         patient with mild systemic disease. After reviewing                         the risks and benefits, the patient was deemed in                         satisfactory condition to undergo the procedure. The                         anesthesia plan was to use monitored anesthesia care                         (MAC). Immediately prior to administration of                         medications, the patient was re-assessed for adequacy                         to receive sedatives. The heart rate, respiratory                         rate, oxygen saturations, blood pressure, adequacy of                         pulmonary ventilation, and response to care were                         monitored throughout the procedure. The physical                         status of the patient was re-assessed after the                         procedure.                        After obtaining informed consent, the colonoscope was                         passed under direct vision. Throughout the procedure,                         the patient's blood pressure, pulse, and oxygen  saturations were monitored continuously. The                         Colonoscope was introduced through the anus and                         advanced to the the cecum, identified by appendiceal                         orifice and ileocecal valve. The colonoscopy was                         somewhat difficult due to a tortuous colon. Successful                         completion of the procedure was aided by applying                         abdominal pressure and lavage. The patient tolerated                         the procedure well. The quality of the bowel                         preparation was evaluated using the BBPS [facility] Bowel                         Preparation  Scale) with scores of: Right Colon = 3,                         Transverse Colon = 3 and Left Colon = 3 (entire mucosa                         seen well with no residual staining, small fragments                         of stool or opaque liquid). The total BBPS score                         equals 9. The ileocecal valve, appendiceal orifice,                         and rectum were photographed. Findings:      The perianal and digital rectal examinations were normal. Pertinent       negatives include normal sphincter tone.      Benign narrowing versus tortuosity of the colon at 20 cm, Estimated       blood loss: none.      Five sessile polyps were found in the rectum (2), sigmoid colon (1),       transverse colon (1) and cecum (1). The polyps were 1 to 2 mm in size.       These polyps were removed with a jumbo cold forceps. Resection and       retrieval were complete. Estimated blood loss was minimal.      The exam was otherwise without abnormality on direct and retroflexion       views. Impression:            -  Five 1 to 2 mm polyps in the rectum, in the sigmoid                         colon, in the transverse colon and in the cecum,                         removed with a jumbo cold forceps. Resected and                         retrieved.                        - The examination was otherwise normal on direct and                         retroflexion views. Recommendation:        - Patient has a contact number available for                         emergencies. The signs and symptoms of potential                         delayed complications were discussed with the patient.                         Return to normal activities tomorrow. Written                         discharge instructions were provided to the patient.                        - Discharge patient to home.                        - Resume previous diet.                        - Continue present medications.                         - Await pathology results.                        - Repeat colonoscopy for surveillance based on                         pathology results.                        - Return to referring physician as previously                         scheduled.                        - The findings and recommendations were discussed with                         the patient. Procedure Code(s):     --- Professional ---  28413, Colonoscopy, flexible; with biopsy, single or                         multiple Diagnosis Code(s):     --- Professional ---                        Z86.010, Personal history of colonic polyps                        D12.8, Benign neoplasm of rectum                        D12.5, Benign neoplasm of sigmoid colon                        D12.3, Benign neoplasm of transverse colon (hepatic                         flexure or splenic flexure)                        D12.0, Benign neoplasm of cecum CPT copyright 2022 [employer]. All rights reserved. The codes documented in this report are preliminary and upon coder review may  be revised to meet current compliance requirements. Attending Participation:      I personally performed the entire procedure. Polo Brisk, DO Quintin Buckle DO, DO 03/02/2024 11:31:45 AM This report has been signed electronically. Number of Addenda: 0 Note Initiated On: 03/02/2024 10:48 AM Scope Withdrawal Time: 0 hours 13 minutes 26 seconds  Total Procedure Duration: 0 hours 25 minutes 50 seconds  Estimated Blood Loss:  Estimated blood loss was minimal.      [place]

## 2024-03-02 NOTE — Anesthesia Postprocedure Evaluation (Signed)
 Anesthesia Post Note  Patient: Anthony Guerrero  Procedure(s) Performed: COLONOSCOPY POLYPECTOMY, INTESTINE  Patient location during evaluation: PACU Anesthesia Type: General Level of consciousness: awake Vital Signs Assessment: post-procedure vital signs reviewed and stable Respiratory status: spontaneous breathing Cardiovascular status: stable Anesthetic complications: no   No notable events documented.   Last Vitals:  Vitals:   03/02/24 1142 03/02/24 1152  BP: 93/60 99/73  Pulse: 75 64  Resp: 16 20  Temp:    SpO2: 96% 96%    Last Pain:  Vitals:   03/02/24 1152  TempSrc:   PainSc: 0-No pain                 VAN STAVEREN,Miarose Lippert

## 2024-03-03 ENCOUNTER — Encounter: Payer: Self-pay | Admitting: Gastroenterology

## 2024-03-05 LAB — SURGICAL PATHOLOGY

## 2024-03-07 NOTE — Progress Notes (Signed)
 Referring Physician:  Lyle San, MD [address] [place],  [place] [address]  Primary Physician:  Lyle San, MD  History of Present Illness: 03/08/2024 Mr. Anthony Guerrero is a 63 year old with a history of chronic low back pain and neck pain here today with a chief complaint of ongoing symptoms in both.  He is undergone multiple injections with Dr. Rhesa Celeste with the most recent being in March of this year in his neck. Per chart review he had a SCS placement by Dr. Barth Borne on 07/05/23.  Today he reports ongoing neck and back pain that are chronic for him. In regards to his neck, he reports several years of right greater than left neck and shoulder pain with radiating pain down the length of his arms that is intermittent in nature and associated numbness and tingling into all 5 fingers.  The symptoms seem to be more severe on the right side.  He denies any significant weakness.  The symptoms have not changed in the last several years and do seem to improve some with injections. In regards to his lumbar spine.  He has had chronic low back pain into the right buttock and radiating down the right leg with similar symptoms on the left side to a lesser severity.  He has constant numbness and tingling into the bottom of his feet.  He reports about 90% improvement of his symptoms since his stimulator was placed so long as it remains charged.  Largely his symptoms in regards to both his neck and back have not changed in severity or location.  He works a active job as a Gaffer and states that it is difficult for him to do lots of lifting and is working to seek permanent disability.  Conservative measures:  Physical therapy: PT at [facility] for his neck and back, 24 visits total this year Multimodal medical therapy including regular antiinflammatories: gabapentin, celebrex, robaxin , lidocaine  patches  Injections:   01/02/24: Right C7-T1 ESI (Dr. Rhesa Celeste) 09/12/23: Right C7-T1  ESI(Dr. Rhesa Celeste) 06/27/23: Right C7-T1 ESI(Dr. Rhesa Celeste) 04/06/23: Right C7-T1 ESI(Dr. Rhesa Celeste) 02/16/23: Right L5-S1 ESI (Dr. Rhesa Celeste)  10/14/2022: RFA to the right L4-5 and L5-S1 facet joints 07/15/2022: MBB to the right L4-5 and L5-S1 facet joints (9/10 to 1-2/10) 06/18/2022: MBB to the right L4-5 and L5-S1 facet joints (100% relief during anesthetic phase) 03/22/2022: right L5-S1 and S1 transforaminal ESI (50% relief) 11/09/2021: right L5-S1 and S1 transforaminal ESI (50% relief) 07/24/2021: Right S1 transforaminal ESI (no relief) 07/02/2021: Right S1 transforaminal ESI (40% relief)   Past Surgery:  07/05/23: [employer] Spinal Cord Stimulator placed by Dr. Margert Sheerer has no symptoms of cervical myelopathy.  The symptoms are causing a significant impact on the patient's life.   Review of Systems:  A 10 point review of systems is negative, except for the pertinent positives and negatives detailed in the HPI.  Past Medical History: Past Medical History:  Diagnosis Date   Bulging lumbar disc    Sciatica     Past Surgical History: Past Surgical History:  Procedure Laterality Date   APPENDECTOMY     COLONOSCOPY N/A 03/02/2024   Procedure: COLONOSCOPY;  Surgeon: Quintin Buckle, DO;  Location[facility] ENDOSCOPY;  Service: Gastroenterology;  Laterality: N/A;   FRACTURE SURGERY     POLYPECTOMY  03/02/2024   Procedure: POLYPECTOMY, INTESTINE;  Surgeon: Quintin Buckle, DO;  Location: [facility] ENDOSCOPY;  Service: Gastroenterology;;   PULSE GENERATOR IMPLANT N/A 07/05/2023   Procedure: SPINAL CORD  STIMULATOR PERMANANT  IMPLANT;  Surgeon: Renaldo Caroli, MD;  Location: [facility] ORS;  Service: Neurosurgery;  Laterality: N/A;   SPINAL CORD STIMULATOR TRIAL     TOE SURGERY     fractures    Allergies: Allergies as of 03/08/2024   (No Known Allergies)    Medications: Outpatient Encounter Medications as of 03/08/2024  Medication Sig   betamethasone dipropionate 0.05 %  cream Apply topically.   celecoxib (CELEBREX) 100 MG capsule Take 100 mg by mouth 2 (two) times daily.   gabapentin (NEURONTIN) 100 MG capsule Take 100 mg by mouth 3 (three) times daily. 100mg  in the morning and noon, 200mg  at night   latanoprost (XALATAN) 0.005 % ophthalmic solution Place 1 drop into both eyes at bedtime.   lidocaine  (LIDODERM ) 5 % Place 1 patch onto the skin daily. Remove & Discard patch within 12 hours or as directed by MD   methocarbamol  (ROBAXIN ) 750 MG tablet Take 1 tablet (750 mg total) by mouth every 12 (twelve) hours as needed for muscle spasms.   No facility-administered encounter medications on file as of 03/08/2024.    Social History: Social History   Tobacco Use   Smoking status: Former    Types: Cigarettes   Smokeless tobacco: Never   Tobacco comments:    quit 26 years ago  Vaping Use   Vaping status: Never Used  Substance Use Topics   Alcohol use: No   Drug use: No    Family Medical History: No family history on file.  Physical Examination: Today's Vitals   03/08/24 1051  BP: 128/84  Weight: 73 kg  Height: 5\' 6"  (1.676 m)  PainSc: 6   PainLoc: Back   Body mass index is 25.99 kg/m.  General: Patient is well developed, well nourished, calm, collected, and in no apparent distress. Attention to examination is appropriate.  Psychiatric: Patient is non-anxious.  Head:  Pupils equal, round, and reactive to light.  ENT:  Oral mucosa appears well hydrated.  Neck:   Supple.    Respiratory: Patient is breathing without any difficulty.  Extremities: No edema.  Vascular: Palpable dorsal pedal pulses.  Skin:   On exposed skin, there are no abnormal skin lesions.  NEUROLOGICAL:     Awake, alert, oriented to person, place, and time.  Speech is clear and fluent. Fund of knowledge is appropriate.   Cranial Nerves: Pupils equal round and reactive to light.  Facial tone is symmetric.  Facial sensation is symmetric.  ROM of spine: limited  cervical and lumbar movement secondary to pain  Strength: Giveaway strength in bilateral upper and lower extremities that makes full evaluation difficult but patient ambulates well without assistance and appears to be at least 4-/5 throughout BUE and BLE   Reflexes are 2+ and symmetric at the biceps, triceps, brachioradialis, patella and achilles.   Hoffman's is absent.  Clonus is not present.  Toes are down-going.  Bilateral upper and lower extremity sensation is intact to light touch.    Gait is normal.   No difficulty with tandem gait.    Medical Decision Making  Imaging: MRI L spine 02/10/23 FINDINGS: Segmentation:  Standard.   Alignment:  Unchanged trace anterolisthesis at L3-L4.   Vertebrae:  No fracture, evidence of discitis, or bone lesion.   Conus medullaris and cauda equina: Conus extends to the L1-L2 level. Conus and cauda equina appear normal.   Paraspinal and other soft tissues: Unchanged 2.2 cm left renal simple cyst. No follow-up imaging is recommended.  Disc levels:   T12-L1:  Negative.   L1-L2:  Negative.   L2-L3:  New small right foraminal disc protrusion.  No stenosis.   L3-L4: Similar disc uncovering and mild disc bulging with mild-to-moderate bilateral facet arthropathy. Unchanged mild bilateral lateral recess and left neuroforaminal stenosis. No spinal canal or right neuroforaminal stenosis.   L4-L5: Unchanged small broad-based posterior disc protrusion and mild bilateral facet arthropathy. Unchanged mild bilateral lateral recess and neuroforaminal stenosis. No spinal canal stenosis.   L5-S1: Unchanged small shallow central disc protrusion and annular fissure encroaching on the left-greater-than-right descending S1 nerve roots. Unchanged mild bilateral facet arthropathy. Unchanged mild left greater than right lateral recess stenosis. No spinal canal or neuroforaminal stenosis.   IMPRESSION: 1. Mild multilevel lumbar spondylosis as described  above, similar to prior study. No high-grade stenosis or impingement.     Electronically Signed   By: Aleta Anda M.D.   On: 02/15/2023 14:28  MRI T spine 02/10/23 FINDINGS: Alignment:  Physiologic.   Vertebrae: No fracture, evidence of discitis, or bone lesion.   Cord:  Normal signal and morphology.   Paraspinal and other soft tissues: Negative.   Disc levels:   Mild diffuse disc desiccation. No significant disc bulge or herniation. No spinal canal or neuroforaminal stenosis.   IMPRESSION: 1.  No acute abnormality or significant degenerative changes.     Electronically Signed   By: Aleta Anda M.D.   On: 02/15/2023 14:21   I have personally reviewed the images and agree with the above interpretation.  Assessment and Plan: Mr. Escue is a pleasant 63 y.o. male with a longstanding history of cervical and lumbar complaints are largely unchanged.  He is currently being treated by Dr. Rhesa Celeste and has had good response to injections and a spinal cord stimulator placed last year.  We briefly discussed the possibility of updating his MRIs however his symptoms have not changed his MRIs were completed and his neurologic exam is largely reassuring despite his giveaway strength on exam.  He is currently in the process of seeking long-term disability given the physical nature of his job.  We briefly discussed this process and quired about getting a disability rating.  I recommended that he see therapy clinic for an FCE but to discuss with his disability company if this is something they cover.  If not he is willing to provide this out-of-pocket.  If he decides he would like to move forward with this evaluation we more than happy to refer him for this and an impairment rating. He will contact our office one he speaks with his insurer should he decide to move forward with this.    Thank you for involving me in the care of this patient.   I spent a total of 50 minutes in both  face-to-face and non-face-to-face activities for this visit on the date of this encounter including review of outside records, review of symptoms, review of cervical, thoracic, and lumbar MRIs, discussion of further evaluation and treatment options, physical exam, and documentation.   [facility] Karvonen Dept. of Neurosurgery

## 2024-03-08 ENCOUNTER — Encounter: Payer: Self-pay | Admitting: Neurosurgery

## 2024-03-08 ENCOUNTER — Ambulatory Visit: Admitting: Neurosurgery

## 2024-03-08 ENCOUNTER — Telehealth: Payer: Self-pay

## 2024-03-08 VITALS — BP 128/84 | Ht 66.0 in | Wt 161.0 lb

## 2024-03-08 DIAGNOSIS — M47817 Spondylosis without myelopathy or radiculopathy, lumbosacral region: Secondary | ICD-10-CM

## 2024-03-08 DIAGNOSIS — M47816 Spondylosis without myelopathy or radiculopathy, lumbar region: Secondary | ICD-10-CM

## 2024-03-08 DIAGNOSIS — G8929 Other chronic pain: Secondary | ICD-10-CM

## 2024-03-08 DIAGNOSIS — M5137 Other intervertebral disc degeneration, lumbosacral region with discogenic back pain only: Secondary | ICD-10-CM

## 2024-03-08 DIAGNOSIS — M5459 Other low back pain: Secondary | ICD-10-CM

## 2024-03-08 DIAGNOSIS — M5412 Radiculopathy, cervical region: Secondary | ICD-10-CM

## 2024-03-08 NOTE — Telephone Encounter (Signed)
 He saw Anastacio Karvonen today, he requested an EMG and they told him he needs to have Dr. Rhesa Celeste to order it. Can you order and FCE?

## 2024-03-26 ENCOUNTER — Telehealth: Payer: Self-pay | Admitting: Student in an Organized Health Care Education/Training Program
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
## 2024-04-03 ENCOUNTER — Encounter: Payer: Self-pay | Admitting: Student in an Organized Health Care Education/Training Program

## 2024-04-03 ENCOUNTER — Ambulatory Visit
Attending: Student in an Organized Health Care Education/Training Program | Admitting: Student in an Organized Health Care Education/Training Program

## 2024-04-03 VITALS — BP 134/84 | HR 76 | Temp 98.1°F | Resp 16 | Ht 66.0 in | Wt 172.0 lb

## 2024-04-03 DIAGNOSIS — G894 Chronic pain syndrome: Secondary | ICD-10-CM | POA: Insufficient documentation

## 2024-04-03 DIAGNOSIS — M5412 Radiculopathy, cervical region: Secondary | ICD-10-CM | POA: Insufficient documentation

## 2024-04-03 DIAGNOSIS — M546 Pain in thoracic spine: Secondary | ICD-10-CM | POA: Diagnosis present

## 2024-04-03 DIAGNOSIS — G8929 Other chronic pain: Secondary | ICD-10-CM | POA: Diagnosis present
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
## 2024-04-03 NOTE — Progress Notes (Signed)
 Safety precautions to be maintained throughout the outpatient stay will include: orient to surroundings, keep bed in low position, maintain call bell within reach at all times, provide assistance with transfer out of bed and ambulation.

## 2024-04-04 ENCOUNTER — Ambulatory Visit
Admission: RE | Admit: 2024-04-04 | Discharge: 2024-04-04 | Disposition: A | Discharge status: Home / Self Care | Source: Ambulatory Visit | Attending: Student in an Organized Health Care Education/Training Program | Admitting: Student in an Organized Health Care Education/Training Program

## 2024-04-04 ENCOUNTER — Encounter: Payer: Self-pay | Admitting: Student in an Organized Health Care Education/Training Program

## 2024-04-04 ENCOUNTER — Ambulatory Visit (HOSPITAL_BASED_OUTPATIENT_CLINIC_OR_DEPARTMENT_OTHER): Admitting: Student in an Organized Health Care Education/Training Program

## 2024-04-04 VITALS — BP 119/68 | HR 65 | Temp 98.2°F | Resp 20 | Ht 66.0 in | Wt 167.0 lb

## 2024-04-04 DIAGNOSIS — M5412 Radiculopathy, cervical region: Secondary | ICD-10-CM | POA: Diagnosis present

## 2024-04-04 MED ORDER — SODIUM CHLORIDE (PF) 0.9 % IJ SOLN
INTRAMUSCULAR | Status: AC
  Filled 2024-04-04: qty 10

## 2024-04-04 MED ORDER — LIDOCAINE HCL 2 % IJ SOLN
20.0000 mL | Freq: Once | INTRAMUSCULAR | Status: AC
  Administered 2024-04-04: 200 mg
  Filled 2024-04-04: qty 40

## 2024-04-04 MED ORDER — IOHEXOL 180 MG/ML  SOLN
10.0000 mL | Freq: Once | INTRAMUSCULAR | Status: AC
  Administered 2024-04-04: 10 mL via EPIDURAL
  Filled 2024-04-04: qty 20

## 2024-04-04 MED ORDER — DEXAMETHASONE SODIUM PHOSPHATE 10 MG/ML IJ SOLN
10.0000 mg | Freq: Once | INTRAMUSCULAR | Status: AC
  Administered 2024-04-04: 10 mg
  Filled 2024-04-04: qty 1

## 2024-04-04 MED ORDER — ROPIVACAINE HCL 2 MG/ML IJ SOLN
1.0000 mL | Freq: Once | INTRAMUSCULAR | Status: AC
  Administered 2024-04-04: 1 mL via EPIDURAL
  Filled 2024-04-04: qty 20

## 2024-04-04 MED ORDER — SODIUM CHLORIDE 0.9% FLUSH
1.0000 mL | Freq: Once | INTRAVENOUS | Status: AC
  Administered 2024-04-04: 1 mL

## 2024-04-04 NOTE — Progress Notes (Signed)
 Safety precautions to be maintained throughout the outpatient stay will include: orient to surroundings, keep bed in low position, maintain call bell within reach at all times, provide assistance with transfer out of bed and ambulation.

## 2024-04-04 NOTE — Progress Notes (Signed)
 PROVIDER NOTE: Interpretation of information contained herein should be left to medically-trained personnel. Specific patient instructions are provided elsewhere under Patient Instructions section of medical record. This document was created in part using STT-dictation technology, any transcriptional errors that may result from this process are unintentional.  Patient: Anthony Guerrero Type: Established DOB: 1960-11-16 MRN: [id number] PCP: Lyle San, MD  Service: Procedure DOS: 04/04/2024 Setting: Ambulatory Location: Ambulatory outpatient facility Delivery: Face-to-face Provider: Cephus Collin, MD Specialty: Interventional Pain Management Specialty designation: 09 Location: Outpatient facility Ref. Prov.: Lyle San, MD       Interventional Therapy   Procedure: Cervical Epidural Steroid injection (CESI) (Interlaminar) #2 for 2025  Laterality: Right  Level: C7-T1 Imaging: Fluoroscopy-assisted DOS: 04/04/2024  Performed by: Cephus Collin, MD Anesthesia: Local anesthesia (1-2% Lidocaine )  Purpose: Diagnostic/Therapeutic Indications: Cervicalgia, cervical radicular pain, degenerative disc disease, severe enough to impact quality of life or function. 1. Cervical radicular pain    NAS-11 score:   Pre-procedure: 6 /10   Post-procedure: 6 /10      Position  Prep  Materials:  Location setting: Procedure suite Position: Prone, on modified reverse trendelenburg to facilitate breathing, with head in head-cradle. Pillows positioned under chest (below chin-level) with cervical spine flexed. Safety Precautions: Patient was assessed for positional comfort and pressure points before starting the procedure. Prepping solution: DuraPrep (Iodine  Povacrylex [0.7% available iodine ] and Isopropyl Alcohol, 74% w/w) Prep Area: Entire  cervicothoracic region Approach: percutaneous, paramedial Intended target: Posterior cervical epidural space Materials Procedure:  Tray: Epidural Needle(s):  Epidural (Tuohy) Qty: 1 Length: (90mm) 3.5-inch Gauge: 22G   Pre-op H&P Assessment:  Anthony Guerrero is a 63 y.o. (year old), male patient, seen today for interventional treatment. He  has a past surgical history that includes Fracture surgery; Appendectomy; Toe Surgery; Spinal cord stimulator trial; Pulse generator implant (N/A, 07/05/2023); Colonoscopy (N/A, 03/02/2024); and Polypectomy (03/02/2024). Anthony Guerrero has a current medication list which includes the following prescription(s): betamethasone dipropionate, celecoxib, gabapentin, latanoprost, lidocaine , and methocarbamol . His primarily concern today is the Neck Pain  Initial Vital Signs:  Pulse/HCG Rate: 65ECG Heart Rate: 76 Temp: 98.2 F (36.8 C) Resp: 16 BP: 107/73 SpO2: 100 %  BMI: Estimated body mass index is 26.95 kg/m as calculated from the following:   Height as of this encounter: 5' 6 (1.676 m).   Weight as of this encounter: 167 lb (75.8 kg).  Risk Assessment: Allergies: Reviewed. He has no known allergies.  Allergy Precautions: None required Coagulopathies: Reviewed. None identified.  Blood-thinner therapy: None at this time Active Infection(s): Reviewed. None identified. Anthony Guerrero is afebrile  Site Confirmation: Anthony Guerrero was asked to confirm the procedure and laterality before marking the site Procedure checklist: Completed Consent: Before the procedure and under the influence of no sedative(s), amnesic(s), or anxiolytics, the patient was informed of the treatment options, risks and possible complications. To fulfill our ethical and legal obligations, as recommended by the American Medical Association's Code of Ethics, I have informed the patient of my clinical impression; the nature and purpose of the treatment or procedure; the risks, benefits, and possible complications of the intervention; the alternatives, including doing nothing; the risk(s) and benefit(s) of the alternative treatment(s) or procedure(s);  and the risk(s) and benefit(s) of doing nothing. The patient was provided information about the general risks and possible complications associated with the procedure. These may include, but are not limited to: failure to achieve desired goals, infection, bleeding, organ or nerve damage, allergic reactions, paralysis, and death. In addition, the patient  was informed of those risks and complications associated to Spine-related procedures, such as failure to decrease pain; infection (i.e.: Meningitis, epidural or intraspinal abscess); bleeding (i.e.: epidural hematoma, subarachnoid hemorrhage, or any other type of intraspinal or peri-dural bleeding); organ or nerve damage (i.e.: Any type of peripheral nerve, nerve root, or spinal cord injury) with subsequent damage to sensory, motor, and/or autonomic systems, resulting in permanent pain, numbness, and/or weakness of one or several areas of the body; allergic reactions; (i.e.: anaphylactic reaction); and/or death. Furthermore, the patient was informed of those risks and complications associated with the medications. These include, but are not limited to: allergic reactions (i.e.: anaphylactic or anaphylactoid reaction(s)); adrenal axis suppression; blood sugar elevation that in diabetics may result in ketoacidosis or comma; water retention that in patients with history of congestive heart failure may result in shortness of breath, pulmonary edema, and decompensation with resultant heart failure; weight gain; swelling or edema; medication-induced neural toxicity; particulate matter embolism and blood vessel occlusion with resultant organ, and/or nervous system infarction; and/or aseptic necrosis of one or more joints. Finally, the patient was informed that Medicine is not an exact science; therefore, there is also the possibility of unforeseen or unpredictable risks and/or possible complications that may result in a catastrophic outcome. The patient indicated having  understood very clearly. We have given the patient no guarantees and we have made no promises. Enough time was given to the patient to ask questions, all of which were answered to the patient's satisfaction. Mr. Norgaard has indicated that he wanted to continue with the procedure. Attestation: I, the ordering provider, attest that I have discussed with the patient the benefits, risks, side-effects, alternatives, likelihood of achieving goals, and potential problems during recovery for the procedure that I have provided informed consent. Date  Time: 04/04/2024 12:55 PM   Pre-Procedure Preparation:  Monitoring: As per clinic protocol. Respiration, ETCO2, SpO2, BP, heart rate and rhythm monitor placed and checked for adequate function Safety Precautions: Patient was assessed for positional comfort and pressure points before starting the procedure. Time-out: I initiated and conducted the Time-out before starting the procedure, as per protocol. The patient was asked to participate by confirming the accuracy of the Time Out information. Verification of the correct person, site, and procedure were performed and confirmed by me, the nursing staff, and the patient. Time-out conducted as per Joint Commission's Universal Protocol (UP.01.01.01). Time: 1317 Start Time: 1317 hrs.  Description  Narrative of Procedure:          Rationale (medical necessity): procedure needed and proper for the diagnosis and/or treatment of the patient's medical symptoms and needs. Start Time: 1317 hrs. Safety Precautions: Aspiration looking for blood return was conducted prior to all injections. At no point did we inject any substances, as a needle was being advanced. No attempts were made at seeking any paresthesias. Safe injection practices and needle disposal techniques used. Medications properly checked for expiration dates. SDV (single dose vial) medications used. Description of procedure: Protocol guidelines were  followed. The patient was assisted into a comfortable position. The target area was identified and the area prepped in the usual manner. Skin & deeper tissues infiltrated with local anesthetic. Appropriate amount of time allowed to pass for local anesthetics to take effect. Using fluoroscopic guidance, the epidural needle was introduced through the skin, ipsilateral to the reported pain, and advanced to the target area. Posterior laminar os was contacted and the needle walked caudad, until the lamina was cleared. The ligamentum flavum was engaged  and the epidural space identified using "loss-of-resistance technique" with 2-3 ml of PF-NaCl (0.9% NSS), in a 5cc dedicated LOR syringe. (See Imaging guidance below for use of contrast details.) Once proper needle placement was secured, and negative aspiration confirmed, the solution was injected in intermittent fashion, asking for systemic symptoms every 0.5cc. The needles were then removed and the area cleansed, making sure to leave some of the prepping solution back to take advantage of its long term bactericidal properties.  3 cc solution made of 1 cc of preservative-free saline, 1 cc of 0.2% ropivacaine , 1 cc of Decadron  10 mg/cc.   Vitals:   04/04/24 1301 04/04/24 1313 04/04/24 1318  BP: 107/73 131/78 129/76  Pulse: 65    Resp: 16 17 19   Temp: 98.2 F (36.8 C)    SpO2: 100% 99% 100%  Weight: 167 lb (75.8 kg)    Height: 5' 6 (1.676 m)        End Time: 1320 hrs.  Imaging Guidance (Spinal):          Type of Imaging Technique: Fluoroscopy Guidance (Spinal) Indication(s): Assistance in needle guidance and placement for procedures requiring needle placement in or near specific anatomical locations not easily accessible without such assistance. Exposure Time: Please see nurses notes. Contrast: Before injecting any contrast, we confirmed that the patient did not have an allergy to iodine , shellfish, or radiological contrast. Once satisfactory  needle placement was completed at the desired level, radiological contrast was injected. Contrast injected under live fluoroscopy. No contrast complications. See chart for type and volume of contrast used. Fluoroscopic Guidance: I was personally present during the use of fluoroscopy. Tunnel Vision Technique used to obtain the best possible view of the target area. Parallax error corrected before commencing the procedure. Direction-depth-direction technique used to introduce the needle under continuous pulsed fluoroscopy. Once target was reached, antero-posterior, oblique, and lateral fluoroscopic projection used confirm needle placement in all planes. Images permanently stored in EMR. Interpretation: I personally interpreted the imaging intraoperatively. Adequate needle placement confirmed in multiple planes. Appropriate spread of contrast into desired area was observed. No evidence of afferent or efferent intravascular uptake. No intrathecal or subarachnoid spread observed. Permanent images saved into the patient's record.  Post-operative Assessment:  Post-procedure Vital Signs:  Pulse/HCG Rate: 6572 Temp: 98.2 F (36.8 C) Resp: 19 BP: 129/76 SpO2: 100 %  EBL: None  Complications: No immediate post-treatment complications observed by team, or reported by patient.  Note: The patient tolerated the entire procedure well. A repeat set of vitals were taken after the procedure and the patient was kept under observation following institutional policy, for this type of procedure. Post-procedural neurological assessment was performed, showing return to baseline, prior to discharge. The patient was provided with post-procedure discharge instructions, including a section on how to identify potential problems. Should any problems arise concerning this procedure, the patient was given instructions to immediately contact us , at any time, without hesitation. In any case, we plan to contact the patient by  telephone for a follow-up status report regarding this interventional procedure.  Comments:  No additional relevant information.  Plan of Care (POC)  Orders:  Orders Placed This Encounter  Procedures   DG PAIN CLINIC C-ARM 1-60 MIN NO REPORT    Intraoperative interpretation by procedural physician at [facility].    Standing Status:   Standing    Number of Occurrences:   1    Reason for exam::   Assistance in needle guidance and placement for procedures requiring  needle placement in or near specific anatomical locations not easily accessible without such assistance.     Medications ordered for procedure: Meds ordered this encounter  Medications   iohexol  (OMNIPAQUE ) 180 MG/ML injection 10 mL    Must be Myelogram-compatible. If not available, you may substitute with a water-soluble, non-ionic, hypoallergenic, myelogram-compatible radiological contrast medium.   lidocaine  (XYLOCAINE ) 2 % (with pres) injection 400 mg   ropivacaine  (PF) 2 mg/mL (0.2%) (NAROPIN ) injection 1 mL   sodium chloride  flush (NS) 0.9 % injection 1 mL   dexamethasone  (DECADRON ) injection 10 mg   Medications administered: We administered iohexol , lidocaine , ropivacaine  (PF) 2 mg/mL (0.2%), sodium chloride  flush, and dexamethasone .  See the medical record for exact dosing, route, and time of administration.  Follow-up plan:   Return in about 8 weeks (around 05/30/2024), or PPE F2F.       Right L5-S1 ESI, Right C7-T1 ESI 04/06/23, 06/27/23, 09/12/23, 01/02/24, 04/04/24      Recent Visits Date Type Provider Dept  04/03/24 Office Visit Cephus Collin, MD [facility]  02/14/24 Office Visit Cephus Collin, MD [facility]  01/30/24 Office Visit Cephus Collin, MD [facility]  Showing recent visits within past 90 days and meeting all other requirements Today's Visits Date Type Provider Dept  04/04/24 Procedure visit Cephus Collin, MD [facility]  Showing today's  visits and meeting all other requirements Future Appointments No visits were found meeting these conditions. Showing future appointments within next 90 days and meeting all other requirements  Disposition: Discharge home  Discharge (Date  Time): 04/04/2024;   hrs.   Primary Care Physician: Lyle San, MD Location: [facility] Note by: Cephus Collin, MD (TTS technology used. I apologize for any typographical errors that were not detected and corrected.) Date: 04/04/2024; Time: 1:21 PM  Disclaimer:  Medicine is not an Visual merchandiser. The only guarantee in medicine is that nothing is guaranteed. It is important to note that the decision to proceed with this intervention was based on the information collected from the patient. The Data and conclusions were drawn from the patient's questionnaire, the interview, and the physical examination. Because the information was provided in large part by the patient, it cannot be guaranteed that it has not been purposely or unconsciously manipulated. Every effort has been made to obtain as much relevant data as possible for this evaluation. It is important to note that the conclusions that lead to this procedure are derived in large part from the available data. Always take into account that the treatment will also be dependent on availability of resources and existing treatment guidelines, considered by other Pain Management Practitioners as being common knowledge and practice, at the time of the intervention. For Medico-Legal purposes, it is also important to point out that variation in procedural techniques and pharmacological choices are the acceptable norm. The indications, contraindications, technique, and results of the above procedure should only be interpreted and judged by a Board-Certified Interventional Pain Specialist with extensive familiarity and expertise in the same exact procedure and technique.

## 2024-04-04 NOTE — Addendum Note (Signed)
 Addended by: Cephus Collin on: 04/04/2024 02:51 PM   Modules accepted: Orders

## 2024-04-04 NOTE — Patient Instructions (Signed)

## 2024-04-05 ENCOUNTER — Telehealth: Payer: Self-pay | Admitting: Student in an Organized Health Care Education/Training Program

## 2024-04-05 ENCOUNTER — Telehealth: Payer: Self-pay
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
## 2024-04-05 NOTE — Telephone Encounter (Signed)
 No issues post-procedure.

## 2024-04-05 NOTE — Telephone Encounter (Signed)
 Message sent to patient via mychart with Dr. Donel Fujisawa response.

## 2024-05-31 ENCOUNTER — Encounter: Payer: Self-pay | Admitting: Student in an Organized Health Care Education/Training Program

## 2024-05-31 ENCOUNTER — Ambulatory Visit
Attending: Student in an Organized Health Care Education/Training Program | Admitting: Student in an Organized Health Care Education/Training Program

## 2024-05-31 VITALS — BP 116/72 | HR 66 | Temp 97.0°F | Ht 66.0 in | Wt 162.0 lb

## 2024-05-31 DIAGNOSIS — M5116 Intervertebral disc disorders with radiculopathy, lumbar region: Secondary | ICD-10-CM | POA: Insufficient documentation

## 2024-05-31 DIAGNOSIS — G8929 Other chronic pain: Secondary | ICD-10-CM | POA: Insufficient documentation

## 2024-05-31 DIAGNOSIS — M5416 Radiculopathy, lumbar region: Secondary | ICD-10-CM | POA: Diagnosis present

## 2024-05-31 NOTE — Patient Instructions (Signed)
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
## 2024-06-04 ENCOUNTER — Encounter: Payer: Self-pay | Admitting: Student in an Organized Health Care Education/Training Program

## 2024-06-04 ENCOUNTER — Ambulatory Visit
Admission: RE | Admit: 2024-06-04 | Discharge: 2024-06-04 | Disposition: A | Discharge status: Home / Self Care | Source: Ambulatory Visit | Attending: Student in an Organized Health Care Education/Training Program | Admitting: Student in an Organized Health Care Education/Training Program

## 2024-06-04 ENCOUNTER — Ambulatory Visit (HOSPITAL_BASED_OUTPATIENT_CLINIC_OR_DEPARTMENT_OTHER): Admitting: Student in an Organized Health Care Education/Training Program

## 2024-06-04 VITALS — BP 137/92 | HR 67 | Temp 98.0°F | Resp 16 | Ht 66.0 in | Wt 167.0 lb

## 2024-06-04 DIAGNOSIS — M5416 Radiculopathy, lumbar region: Secondary | ICD-10-CM | POA: Diagnosis present

## 2024-06-04 DIAGNOSIS — M5116 Intervertebral disc disorders with radiculopathy, lumbar region: Secondary | ICD-10-CM | POA: Diagnosis not present

## 2024-06-04 DIAGNOSIS — G8929 Other chronic pain: Secondary | ICD-10-CM

## 2024-06-04 MED ORDER — LIDOCAINE HCL 2 % IJ SOLN
20.0000 mL | Freq: Once | INTRAMUSCULAR | Status: AC
  Administered 2024-06-04: 400 mg

## 2024-06-04 MED ORDER — IOHEXOL 180 MG/ML  SOLN
INTRAMUSCULAR | Status: AC
  Filled 2024-06-04: qty 20

## 2024-06-04 MED ORDER — DEXAMETHASONE SODIUM PHOSPHATE 10 MG/ML IJ SOLN
INTRAMUSCULAR | Status: AC
  Filled 2024-06-04: qty 1

## 2024-06-04 MED ORDER — ROPIVACAINE HCL 2 MG/ML IJ SOLN
INTRAMUSCULAR | Status: AC
  Filled 2024-06-04: qty 20

## 2024-06-04 MED ORDER — SODIUM CHLORIDE (PF) 0.9 % IJ SOLN
INTRAMUSCULAR | Status: AC
  Filled 2024-06-04: qty 10

## 2024-06-04 MED ORDER — LIDOCAINE HCL 2 % IJ SOLN
INTRAMUSCULAR | Status: AC
  Filled 2024-06-04: qty 20

## 2024-06-04 MED ORDER — SODIUM CHLORIDE 0.9% FLUSH
2.0000 mL | Freq: Once | INTRAVENOUS | Status: AC
  Administered 2024-06-04: 2 mL

## 2024-06-04 MED ORDER — ROPIVACAINE HCL 2 MG/ML IJ SOLN
2.0000 mL | Freq: Once | INTRAMUSCULAR | Status: AC
  Administered 2024-06-04: 2 mL via EPIDURAL

## 2024-06-04 MED ORDER — IOHEXOL 180 MG/ML  SOLN
10.0000 mL | Freq: Once | INTRAMUSCULAR | Status: AC
  Administered 2024-06-04: 10 mL via EPIDURAL

## 2024-06-04 MED ORDER — DEXAMETHASONE SODIUM PHOSPHATE 10 MG/ML IJ SOLN
20.0000 mg | Freq: Once | INTRAMUSCULAR | Status: AC
  Administered 2024-06-04: 20 mg
  Filled 2024-06-04: qty 2

## 2024-06-04 NOTE — Patient Instructions (Signed)

## 2024-06-04 NOTE — Progress Notes (Signed)
 PROVIDER NOTE: Interpretation of information contained herein should be left to medically-trained personnel. Specific patient instructions are provided elsewhere under Patient Instructions section of medical record. This document was created in part using STT-dictation technology, any transcriptional errors that may result from this process are unintentional.  Patient: Anthony Guerrero Type: Established DOB: November 23, 1960 MRN: [id number] PCP: Valora Agent, MD  Service: Procedure DOS: 06/04/2024 Setting: Ambulatory Location: Ambulatory outpatient facility Delivery: Face-to-face Provider: Wallie Sherry, MD Specialty: Interventional Pain Management Specialty designation: 09 Location: Outpatient facility Ref. Prov.: Valora Agent, MD       Interventional Therapy   Procedure: Lumbar trans-foraminal epidural steroid injection (L-TFESI) #1  Laterality: Right (-RT)  Level: L4 and L5 nerve root(s) Imaging: Fluoroscopy-guided         Anesthesia: Local anesthesia (1-2% Lidocaine ) DOS: 06/04/2024  Performed by: Wallie Sherry, MD  Purpose: Diagnostic/Therapeutic Indications: Lumbar radicular pain severe enough to impact quality of life or function. 1. Lumbar radiculopathy   2. Chronic radicular lumbar pain   3. Lumbar disc herniation with radiculopathy    NAS-11 Pain score:   Pre-procedure: 6 /10   Post-procedure: 4 /10     Position / Prep / Materials:  Position: Prone  Prep solution: ChloraPrep (2% chlorhexidine  gluconate and 70% isopropyl alcohol) Prep Area: Entire Posterior Lumbosacral Area.  From the lower tip of the scapula down to the tailbone and from flank to flank. Materials:  Tray: Block Needle(s):  Type: Spinal  Gauge (G): 22  Length: 3.5-in  Qty: 2     H&P (Pre-op Assessment):  [name] is a 63 y.o. (year old), male patient, seen today for interventional treatment. He  has a past surgical history that includes Fracture surgery; Appendectomy; Toe Surgery; Spinal cord  stimulator trial; Pulse generator implant (N/A, 07/05/2023); Colonoscopy (N/A, 03/02/2024); and Polypectomy (03/02/2024). [name] has a current medication list which includes the following prescription(s): betamethasone dipropionate, celecoxib, gabapentin, latanoprost, lidocaine , and methocarbamol , and the following Facility-Administered Medications: dexamethasone , iohexol , lidocaine , ropivacaine  (pf) 2 mg/ml (0.2%), and sodium chloride  flush. His primarily concern today is the Back Pain (Right, lower)  Initial Vital Signs:  Pulse/HCG Rate: 67ECG Heart Rate: 76 Temp: 98 F (36.7 C) Resp: 16 BP: 106/82 SpO2: 99 %  BMI: Estimated body mass index is 26.95 kg/m as calculated from the following:   Height as of this encounter: 5' 6 (1.676 m).   Weight as of this encounter: 167 lb (75.8 kg).  Risk Assessment: Allergies: Reviewed. He has no known allergies.  Allergy Precautions: None required Coagulopathies: Reviewed. None identified.  Blood-thinner therapy: None at this time Active Infection(s): Reviewed. None identified. [name] is afebrile  Site Confirmation: [name] was asked to confirm the procedure and laterality before marking the site Procedure checklist: Completed Consent: Before the procedure and under the influence of no sedative(s), amnesic(s), or anxiolytics, the patient was informed of the treatment options, risks and possible complications. To fulfill our ethical and legal obligations, as recommended by the American Medical Association's Code of Ethics, I have informed the patient of my clinical impression; the nature and purpose of the treatment or procedure; the risks, benefits, and possible complications of the intervention; the alternatives, including doing nothing; the risk(s) and benefit(s) of the alternative treatment(s) or procedure(s); and the risk(s) and benefit(s) of doing nothing. The patient was provided information about the general risks and possible  complications associated with the procedure. These may include, but are not limited to: failure to achieve desired goals, infection, bleeding, organ or  nerve damage, allergic reactions, paralysis, and death. In addition, the patient was informed of those risks and complications associated to Spine-related procedures, such as failure to decrease pain; infection (i.e.: Meningitis, epidural or intraspinal abscess); bleeding (i.e.: epidural hematoma, subarachnoid hemorrhage, or any other type of intraspinal or peri-dural bleeding); organ or nerve damage (i.e.: Any type of peripheral nerve, nerve root, or spinal cord injury) with subsequent damage to sensory, motor, and/or autonomic systems, resulting in permanent pain, numbness, and/or weakness of one or several areas of the body; allergic reactions; (i.e.: anaphylactic reaction); and/or death. Furthermore, the patient was informed of those risks and complications associated with the medications. These include, but are not limited to: allergic reactions (i.e.: anaphylactic or anaphylactoid reaction(s)); adrenal axis suppression; blood sugar elevation that in diabetics may result in ketoacidosis or comma; water retention that in patients with history of congestive heart failure may result in shortness of breath, pulmonary edema, and decompensation with resultant heart failure; weight gain; swelling or edema; medication-induced neural toxicity; particulate matter embolism and blood vessel occlusion with resultant organ, and/or nervous system infarction; and/or aseptic necrosis of one or more joints. Finally, the patient was informed that Medicine is not an exact science; therefore, there is also the possibility of unforeseen or unpredictable risks and/or possible complications that may result in a catastrophic outcome. The patient indicated having understood very clearly. We have given the patient no guarantees and we have made no promises. Enough time was given to the  patient to ask questions, all of which were answered to the patient's satisfaction. [name] has indicated that he wanted to continue with the procedure. Attestation: I, the ordering provider, attest that I have discussed with the patient the benefits, risks, side-effects, alternatives, likelihood of achieving goals, and potential problems during recovery for the procedure that I have provided informed consent. Date  Time: 06/04/2024 12:58 PM  Pre-Procedure Preparation:  Monitoring: As per clinic protocol. Respiration, ETCO2, SpO2, BP, heart rate and rhythm monitor placed and checked for adequate function Safety Precautions: Patient was assessed for positional comfort and pressure points before starting the procedure. Time-out: I initiated and conducted the Time-out before starting the procedure, as per protocol. The patient was asked to participate by confirming the accuracy of the Time Out information. Verification of the correct person, site, and procedure were performed and confirmed by me, the nursing staff, and the patient. Time-out conducted as per Joint Commission's Universal Protocol (UP.01.01.01). Time: 1328 Start Time: 1328 hrs.  Description/Narrative of Procedure:          Target: The 6 o'clock position under the pedicle, on the affected side. Region: Posterolateral Lumbosacral Approach: Posterior Percutaneous Paravertebral approach.  Rationale (medical necessity): procedure needed and proper for the diagnosis and/or treatment of the patient's medical symptoms and needs. Procedural Technique Safety Precautions: Aspiration looking for blood return was conducted prior to all injections. At no point did we inject any substances, as a needle was being advanced. No attempts were made at seeking any paresthesias. Safe injection practices and needle disposal techniques used. Medications properly checked for expiration dates. SDV (single dose vial) medications used. Description of the  Procedure: Protocol guidelines were followed. The patient was placed in position over the procedure table. The target area was identified and the area prepped in the usual manner. Skin & deeper tissues infiltrated with local anesthetic. Appropriate amount of time allowed to pass for local anesthetics to take effect. The procedure needles were then advanced to the target area. Proper needle  placement secured. Negative aspiration confirmed. Solution injected in intermittent fashion, asking for systemic symptoms every 0.5cc of injectate. The needles were then removed and the area cleansed, making sure to leave some of the prepping solution back to take advantage of its long term bactericidal properties.  Vitals:   06/04/24 1309 06/04/24 1323 06/04/24 1329 06/04/24 1339  BP: 106/82 (!) 131/94 (!) 136/94 (!) 137/92  Pulse: 67     Resp: 16 15 17 16   Temp: 98 F (36.7 C)     TempSrc: Temporal     SpO2: 99% 100% 100% 100%  Weight: 167 lb (75.8 kg)     Height: 5' 6 (1.676 m)       Start Time: 1328 hrs. End Time: 1335 hrs.  Imaging Guidance (Spinal):          Type of Imaging Technique: Fluoroscopy Guidance (Spinal) Indication(s): Fluoroscopy guidance for needle placement to enhance accuracy in procedures requiring precise needle localization for targeted delivery of medication in or near specific anatomical locations not easily accessible without such real-time imaging assistance. Exposure Time: Please see nurses notes. Contrast: Before injecting any contrast, we confirmed that the patient did not have an allergy to iodine , shellfish, or radiological contrast. Once satisfactory needle placement was completed at the desired level, radiological contrast was injected. Contrast injected under live fluoroscopy. No contrast complications. See chart for type and volume of contrast used. Fluoroscopic Guidance: I was personally present during the use of fluoroscopy. Tunnel Vision Technique used to obtain the  best possible view of the target area. Parallax error corrected before commencing the procedure. Direction-depth-direction technique used to introduce the needle under continuous pulsed fluoroscopy. Once target was reached, antero-posterior, oblique, and lateral fluoroscopic projection used confirm needle placement in all planes. Images permanently stored in EMR. Interpretation: I personally interpreted the imaging intraoperatively. Adequate needle placement confirmed in multiple planes. Appropriate spread of contrast into desired area was observed. No evidence of afferent or efferent intravascular uptake. No intrathecal or subarachnoid spread observed. Permanent images saved into the patient's record.  Post-operative Assessment:  Post-procedure Vital Signs:  Pulse/HCG Rate: 6784 Temp: 98 F (36.7 C) Resp: 16 BP: (!) 137/92 SpO2: 100 %  EBL: None  Complications: No immediate post-treatment complications observed by team, or reported by patient.  Note: The patient tolerated the entire procedure well. A repeat set of vitals were taken after the procedure and the patient was kept under observation following institutional policy, for this type of procedure. Post-procedural neurological assessment was performed, showing return to baseline, prior to discharge. The patient was provided with post-procedure discharge instructions, including a section on how to identify potential problems. Should any problems arise concerning this procedure, the patient was given instructions to immediately contact us , at any time, without hesitation. In any case, we plan to contact the patient by telephone for a follow-up status report regarding this interventional procedure.  Comments:  No additional relevant information.  Plan of Care (POC)  Orders:  Orders Placed This Encounter  Procedures   [facility] C-ARM 1-60 MIN NO REPORT    Intraoperative interpretation by procedural physician at [facility].    Standing Status:   Standing    Number of Occurrences:   1    Reason for exam::   Assistance in needle guidance and placement for procedures requiring needle placement in or near specific anatomical locations not easily accessible without such assistance.     Medications ordered for procedure: Meds ordered this encounter  Medications   lidocaine  (XYLOCAINE ) 2 % (with pres) injection 400 mg   iohexol  (OMNIPAQUE ) 180 MG/ML injection 10 mL    Must be Myelogram-compatible. If not available, you may substitute with a water-soluble, non-ionic, hypoallergenic, myelogram-compatible radiological contrast medium.   sodium chloride  flush (NS) 0.9 % injection 2 mL    This is for a two (2) level block. Use two (2) syringes and divide content in half.   ropivacaine  (PF) 2 mg/mL (0.2%) (NAROPIN ) injection 2 mL    This is for a two (2) level block. Use two (2) syringes and divide content in half.   dexamethasone  (DECADRON ) injection 20 mg    This is for a two (2) level block. Use two (2) syringes and divide content in half.   Medications administered: Dreydon K. Skop had no medications administered during this visit.  See the medical record for exact dosing, route, and time of administration.    Right L5-S1 ESI, Right C7-T1 ESI 04/06/23, 06/27/23, 09/12/23, 01/02/24, 04/04/24; right L4, L5 transforaminal ESI 06/04/2024      Follow-up plan:   Return for patient will call to schedule F2F appt prn.     Recent Visits Date Type Provider Dept  05/31/24 Office Visit Marcelino Nurse, MD [facility]  04/04/24 Procedure visit Marcelino Nurse, MD [facility]  04/03/24 Office Visit Marcelino Nurse, MD [facility]  Showing recent visits within past 90 days and meeting all other requirements Today's Visits Date Type Provider Dept  06/04/24 Procedure visit Marcelino Nurse, MD [facility]  Showing today's visits and meeting all other requirements Future  Appointments No visits were found meeting these conditions. Showing future appointments within next 90 days and meeting all other requirements   Disposition: Discharge home  Discharge (Date  Time): 06/04/2024; 1341 hrs.   Primary Care Physician: Valora Agent, MD Location: [facility] Note by: Nurse Marcelino, MD (TTS technology used. I apologize for any typographical errors that were not detected and corrected.) Date: 06/04/2024; Time: 2:01 PM  Disclaimer:  Medicine is not an Visual merchandiser. The only guarantee in medicine is that nothing is guaranteed. It is important to note that the decision to proceed with this intervention was based on the information collected from the patient. The Data and conclusions were drawn from the patient's questionnaire, the interview, and the physical examination. Because the information was provided in large part by the patient, it cannot be guaranteed that it has not been purposely or unconsciously manipulated. Every effort has been made to obtain as much relevant data as possible for this evaluation. It is important to note that the conclusions that lead to this procedure are derived in large part from the available data. Always take into account that the treatment will also be dependent on availability of resources and existing treatment guidelines, considered by other Pain Management Practitioners as being common knowledge and practice, at the time of the intervention. For Medico-Legal purposes, it is also important to point out that variation in procedural techniques and pharmacological choices are the acceptable norm. The indications, contraindications, technique, and results of the above procedure should only be interpreted and judged by a Board-Certified Interventional Pain Specialist with extensive familiarity and expertise in the same exact procedure and technique.

## 2024-06-05 ENCOUNTER — Telehealth: Payer: Self-pay

## 2024-06-05 NOTE — Telephone Encounter (Signed)
 Post procedure follow up.  Patient states she is doing good.
